# Patient Record
Sex: Male | Born: 1960 | Race: White | Hispanic: No | Marital: Single | Smoking: Former smoker
Health system: Southern US, Community
[De-identification: ages and names within clinical notes are randomized; demographics above are authoritative.]

## PROBLEM LIST (undated history)

## (undated) DIAGNOSIS — J189 Pneumonia, unspecified organism: Secondary | ICD-10-CM

## (undated) DIAGNOSIS — R7303 Prediabetes: Secondary | ICD-10-CM

## (undated) DIAGNOSIS — R0600 Dyspnea, unspecified: Secondary | ICD-10-CM

## (undated) DIAGNOSIS — J45909 Unspecified asthma, uncomplicated: Secondary | ICD-10-CM

## (undated) DIAGNOSIS — J449 Chronic obstructive pulmonary disease, unspecified: Secondary | ICD-10-CM

## (undated) HISTORY — PX: NO PAST SURGERIES: SHX2092

---

## 2015-09-19 DIAGNOSIS — J441 Chronic obstructive pulmonary disease with (acute) exacerbation: Secondary | ICD-10-CM | POA: Diagnosis not present

## 2015-09-19 DIAGNOSIS — J9621 Acute and chronic respiratory failure with hypoxia: Secondary | ICD-10-CM | POA: Diagnosis not present

## 2015-09-20 DIAGNOSIS — J9621 Acute and chronic respiratory failure with hypoxia: Secondary | ICD-10-CM | POA: Diagnosis not present

## 2015-09-20 DIAGNOSIS — J441 Chronic obstructive pulmonary disease with (acute) exacerbation: Secondary | ICD-10-CM | POA: Diagnosis not present

## 2015-09-21 DIAGNOSIS — J9621 Acute and chronic respiratory failure with hypoxia: Secondary | ICD-10-CM | POA: Diagnosis not present

## 2015-09-21 DIAGNOSIS — J441 Chronic obstructive pulmonary disease with (acute) exacerbation: Secondary | ICD-10-CM | POA: Diagnosis not present

## 2015-09-22 DIAGNOSIS — J441 Chronic obstructive pulmonary disease with (acute) exacerbation: Secondary | ICD-10-CM | POA: Diagnosis not present

## 2015-09-22 DIAGNOSIS — J9621 Acute and chronic respiratory failure with hypoxia: Secondary | ICD-10-CM | POA: Diagnosis not present

## 2016-01-30 DIAGNOSIS — J441 Chronic obstructive pulmonary disease with (acute) exacerbation: Secondary | ICD-10-CM | POA: Diagnosis not present

## 2016-01-30 DIAGNOSIS — J9621 Acute and chronic respiratory failure with hypoxia: Secondary | ICD-10-CM | POA: Diagnosis not present

## 2016-01-30 DIAGNOSIS — J189 Pneumonia, unspecified organism: Secondary | ICD-10-CM | POA: Diagnosis not present

## 2016-01-31 DIAGNOSIS — J9621 Acute and chronic respiratory failure with hypoxia: Secondary | ICD-10-CM | POA: Diagnosis not present

## 2016-01-31 DIAGNOSIS — J441 Chronic obstructive pulmonary disease with (acute) exacerbation: Secondary | ICD-10-CM | POA: Diagnosis not present

## 2016-01-31 DIAGNOSIS — J189 Pneumonia, unspecified organism: Secondary | ICD-10-CM | POA: Diagnosis not present

## 2016-02-01 DIAGNOSIS — J9621 Acute and chronic respiratory failure with hypoxia: Secondary | ICD-10-CM | POA: Diagnosis not present

## 2016-02-01 DIAGNOSIS — J189 Pneumonia, unspecified organism: Secondary | ICD-10-CM | POA: Diagnosis not present

## 2016-02-01 DIAGNOSIS — J441 Chronic obstructive pulmonary disease with (acute) exacerbation: Secondary | ICD-10-CM | POA: Diagnosis not present

## 2016-02-02 DIAGNOSIS — J9621 Acute and chronic respiratory failure with hypoxia: Secondary | ICD-10-CM | POA: Diagnosis not present

## 2016-02-02 DIAGNOSIS — J441 Chronic obstructive pulmonary disease with (acute) exacerbation: Secondary | ICD-10-CM | POA: Diagnosis not present

## 2016-02-02 DIAGNOSIS — J189 Pneumonia, unspecified organism: Secondary | ICD-10-CM | POA: Diagnosis not present

## 2016-03-11 DIAGNOSIS — J9692 Respiratory failure, unspecified with hypercapnia: Secondary | ICD-10-CM

## 2016-03-11 DIAGNOSIS — J441 Chronic obstructive pulmonary disease with (acute) exacerbation: Secondary | ICD-10-CM | POA: Diagnosis not present

## 2016-03-12 DIAGNOSIS — J441 Chronic obstructive pulmonary disease with (acute) exacerbation: Secondary | ICD-10-CM | POA: Diagnosis not present

## 2016-03-12 DIAGNOSIS — J9692 Respiratory failure, unspecified with hypercapnia: Secondary | ICD-10-CM | POA: Diagnosis not present

## 2016-03-13 DIAGNOSIS — J9692 Respiratory failure, unspecified with hypercapnia: Secondary | ICD-10-CM | POA: Diagnosis not present

## 2016-03-13 DIAGNOSIS — J441 Chronic obstructive pulmonary disease with (acute) exacerbation: Secondary | ICD-10-CM | POA: Diagnosis not present

## 2016-03-14 DIAGNOSIS — J441 Chronic obstructive pulmonary disease with (acute) exacerbation: Secondary | ICD-10-CM | POA: Diagnosis not present

## 2016-03-14 DIAGNOSIS — J9692 Respiratory failure, unspecified with hypercapnia: Secondary | ICD-10-CM | POA: Diagnosis not present

## 2016-03-15 DIAGNOSIS — J441 Chronic obstructive pulmonary disease with (acute) exacerbation: Secondary | ICD-10-CM | POA: Diagnosis not present

## 2016-03-15 DIAGNOSIS — J9692 Respiratory failure, unspecified with hypercapnia: Secondary | ICD-10-CM | POA: Diagnosis not present

## 2016-11-19 DIAGNOSIS — J9621 Acute and chronic respiratory failure with hypoxia: Secondary | ICD-10-CM

## 2016-11-19 DIAGNOSIS — J159 Unspecified bacterial pneumonia: Secondary | ICD-10-CM

## 2016-11-19 DIAGNOSIS — J181 Lobar pneumonia, unspecified organism: Secondary | ICD-10-CM

## 2016-11-19 DIAGNOSIS — J441 Chronic obstructive pulmonary disease with (acute) exacerbation: Secondary | ICD-10-CM

## 2016-11-19 DIAGNOSIS — Z8709 Personal history of other diseases of the respiratory system: Secondary | ICD-10-CM

## 2016-11-19 DIAGNOSIS — Z87891 Personal history of nicotine dependence: Secondary | ICD-10-CM

## 2017-02-23 DIAGNOSIS — J159 Unspecified bacterial pneumonia: Secondary | ICD-10-CM | POA: Diagnosis not present

## 2017-02-23 DIAGNOSIS — J9621 Acute and chronic respiratory failure with hypoxia: Secondary | ICD-10-CM | POA: Diagnosis not present

## 2017-02-23 DIAGNOSIS — J441 Chronic obstructive pulmonary disease with (acute) exacerbation: Secondary | ICD-10-CM | POA: Diagnosis not present

## 2017-02-24 DIAGNOSIS — J159 Unspecified bacterial pneumonia: Secondary | ICD-10-CM | POA: Diagnosis not present

## 2017-02-24 DIAGNOSIS — J9621 Acute and chronic respiratory failure with hypoxia: Secondary | ICD-10-CM | POA: Diagnosis not present

## 2017-02-24 DIAGNOSIS — J441 Chronic obstructive pulmonary disease with (acute) exacerbation: Secondary | ICD-10-CM | POA: Diagnosis not present

## 2017-05-02 DIAGNOSIS — J441 Chronic obstructive pulmonary disease with (acute) exacerbation: Secondary | ICD-10-CM | POA: Diagnosis not present

## 2017-05-02 DIAGNOSIS — J9621 Acute and chronic respiratory failure with hypoxia: Secondary | ICD-10-CM | POA: Diagnosis not present

## 2017-05-03 DIAGNOSIS — J9621 Acute and chronic respiratory failure with hypoxia: Secondary | ICD-10-CM | POA: Diagnosis not present

## 2017-05-03 DIAGNOSIS — J441 Chronic obstructive pulmonary disease with (acute) exacerbation: Secondary | ICD-10-CM | POA: Diagnosis not present

## 2018-04-01 DIAGNOSIS — E871 Hypo-osmolality and hyponatremia: Secondary | ICD-10-CM

## 2018-04-01 DIAGNOSIS — J9621 Acute and chronic respiratory failure with hypoxia: Secondary | ICD-10-CM

## 2018-04-01 DIAGNOSIS — J441 Chronic obstructive pulmonary disease with (acute) exacerbation: Secondary | ICD-10-CM | POA: Diagnosis not present

## 2018-04-01 DIAGNOSIS — Z720 Tobacco use: Secondary | ICD-10-CM

## 2018-04-02 DIAGNOSIS — Z720 Tobacco use: Secondary | ICD-10-CM | POA: Diagnosis not present

## 2018-04-02 DIAGNOSIS — J9621 Acute and chronic respiratory failure with hypoxia: Secondary | ICD-10-CM | POA: Diagnosis not present

## 2018-04-02 DIAGNOSIS — E871 Hypo-osmolality and hyponatremia: Secondary | ICD-10-CM | POA: Diagnosis not present

## 2018-04-02 DIAGNOSIS — J441 Chronic obstructive pulmonary disease with (acute) exacerbation: Secondary | ICD-10-CM | POA: Diagnosis not present

## 2018-04-03 DIAGNOSIS — E871 Hypo-osmolality and hyponatremia: Secondary | ICD-10-CM | POA: Diagnosis not present

## 2018-04-03 DIAGNOSIS — J441 Chronic obstructive pulmonary disease with (acute) exacerbation: Secondary | ICD-10-CM | POA: Diagnosis not present

## 2018-04-03 DIAGNOSIS — Z720 Tobacco use: Secondary | ICD-10-CM | POA: Diagnosis not present

## 2018-04-03 DIAGNOSIS — J9621 Acute and chronic respiratory failure with hypoxia: Secondary | ICD-10-CM | POA: Diagnosis not present

## 2018-05-20 DIAGNOSIS — J441 Chronic obstructive pulmonary disease with (acute) exacerbation: Secondary | ICD-10-CM | POA: Diagnosis not present

## 2018-05-20 DIAGNOSIS — Z87891 Personal history of nicotine dependence: Secondary | ICD-10-CM

## 2018-05-20 DIAGNOSIS — R0902 Hypoxemia: Secondary | ICD-10-CM

## 2018-05-20 DIAGNOSIS — R0689 Other abnormalities of breathing: Secondary | ICD-10-CM

## 2018-05-20 DIAGNOSIS — J9621 Acute and chronic respiratory failure with hypoxia: Secondary | ICD-10-CM

## 2018-05-21 DIAGNOSIS — R0689 Other abnormalities of breathing: Secondary | ICD-10-CM | POA: Diagnosis not present

## 2018-05-21 DIAGNOSIS — J441 Chronic obstructive pulmonary disease with (acute) exacerbation: Secondary | ICD-10-CM | POA: Diagnosis not present

## 2018-05-21 DIAGNOSIS — J9621 Acute and chronic respiratory failure with hypoxia: Secondary | ICD-10-CM | POA: Diagnosis not present

## 2018-05-21 DIAGNOSIS — R0902 Hypoxemia: Secondary | ICD-10-CM | POA: Diagnosis not present

## 2018-05-22 DIAGNOSIS — J9621 Acute and chronic respiratory failure with hypoxia: Secondary | ICD-10-CM | POA: Diagnosis not present

## 2018-05-22 DIAGNOSIS — J441 Chronic obstructive pulmonary disease with (acute) exacerbation: Secondary | ICD-10-CM | POA: Diagnosis not present

## 2018-05-22 DIAGNOSIS — R0902 Hypoxemia: Secondary | ICD-10-CM | POA: Diagnosis not present

## 2018-05-22 DIAGNOSIS — R0689 Other abnormalities of breathing: Secondary | ICD-10-CM | POA: Diagnosis not present

## 2018-05-23 DIAGNOSIS — R0902 Hypoxemia: Secondary | ICD-10-CM | POA: Diagnosis not present

## 2018-05-23 DIAGNOSIS — R0689 Other abnormalities of breathing: Secondary | ICD-10-CM | POA: Diagnosis not present

## 2018-05-23 DIAGNOSIS — Z87891 Personal history of nicotine dependence: Secondary | ICD-10-CM | POA: Diagnosis not present

## 2018-05-23 DIAGNOSIS — J441 Chronic obstructive pulmonary disease with (acute) exacerbation: Secondary | ICD-10-CM | POA: Diagnosis not present

## 2018-06-02 ENCOUNTER — Inpatient Hospital Stay (HOSPITAL_COMMUNITY)
Admission: AD | Admit: 2018-06-02 | Discharge: 2018-06-07 | DRG: 193 | Disposition: A | Discharge status: Home / Self Care | Payer: Medicaid Other | Source: Other Acute Inpatient Hospital | Attending: Internal Medicine | Admitting: Internal Medicine

## 2018-06-02 DIAGNOSIS — J449 Chronic obstructive pulmonary disease, unspecified: Secondary | ICD-10-CM | POA: Diagnosis not present

## 2018-06-02 DIAGNOSIS — Z87891 Personal history of nicotine dependence: Secondary | ICD-10-CM | POA: Diagnosis not present

## 2018-06-02 DIAGNOSIS — Z880 Allergy status to penicillin: Secondary | ICD-10-CM | POA: Diagnosis not present

## 2018-06-02 DIAGNOSIS — R740 Nonspecific elevation of levels of transaminase and lactic acid dehydrogenase [LDH]: Secondary | ICD-10-CM | POA: Diagnosis not present

## 2018-06-02 DIAGNOSIS — R05 Cough: Secondary | ICD-10-CM | POA: Diagnosis present

## 2018-06-02 DIAGNOSIS — R21 Rash and other nonspecific skin eruption: Secondary | ICD-10-CM | POA: Diagnosis present

## 2018-06-02 DIAGNOSIS — Z8701 Personal history of pneumonia (recurrent): Secondary | ICD-10-CM

## 2018-06-02 DIAGNOSIS — T380X5A Adverse effect of glucocorticoids and synthetic analogues, initial encounter: Secondary | ICD-10-CM | POA: Diagnosis not present

## 2018-06-02 DIAGNOSIS — R6889 Other general symptoms and signs: Secondary | ICD-10-CM | POA: Diagnosis not present

## 2018-06-02 DIAGNOSIS — J9621 Acute and chronic respiratory failure with hypoxia: Secondary | ICD-10-CM | POA: Diagnosis not present

## 2018-06-02 DIAGNOSIS — J441 Chronic obstructive pulmonary disease with (acute) exacerbation: Secondary | ICD-10-CM | POA: Diagnosis present

## 2018-06-02 DIAGNOSIS — J13 Pneumonia due to Streptococcus pneumoniae: Secondary | ICD-10-CM | POA: Diagnosis present

## 2018-06-02 DIAGNOSIS — Z9981 Dependence on supplemental oxygen: Secondary | ICD-10-CM

## 2018-06-02 DIAGNOSIS — R7989 Other specified abnormal findings of blood chemistry: Secondary | ICD-10-CM | POA: Diagnosis not present

## 2018-06-02 DIAGNOSIS — R7982 Elevated C-reactive protein (CRP): Secondary | ICD-10-CM | POA: Diagnosis not present

## 2018-06-02 DIAGNOSIS — J9622 Acute and chronic respiratory failure with hypercapnia: Secondary | ICD-10-CM | POA: Diagnosis not present

## 2018-06-02 DIAGNOSIS — J181 Lobar pneumonia, unspecified organism: Secondary | ICD-10-CM | POA: Diagnosis not present

## 2018-06-02 HISTORY — DX: Pneumonia, unspecified organism: J18.9

## 2018-06-02 HISTORY — DX: Chronic obstructive pulmonary disease, unspecified: J44.9

## 2018-06-02 HISTORY — DX: Unspecified asthma, uncomplicated: J45.909

## 2018-06-02 HISTORY — DX: Prediabetes: R73.03

## 2018-06-02 HISTORY — DX: Dyspnea, unspecified: R06.00

## 2018-06-02 LAB — GLUCOSE, CAPILLARY: Glucose-Capillary: 187 mg/dL — ABNORMAL HIGH (ref 70–99)

## 2018-06-02 MED ORDER — ORAL CARE MOUTH RINSE
15.0000 mL | Freq: Two times a day (BID) | OROMUCOSAL | Status: DC
  Administered 2018-06-03 – 2018-06-07 (×8): 15 mL via OROMUCOSAL

## 2018-06-03 ENCOUNTER — Encounter (HOSPITAL_COMMUNITY): Payer: Self-pay

## 2018-06-03 ENCOUNTER — Inpatient Hospital Stay (HOSPITAL_COMMUNITY): Payer: Medicaid Other

## 2018-06-03 ENCOUNTER — Other Ambulatory Visit: Payer: Self-pay

## 2018-06-03 DIAGNOSIS — R7982 Elevated C-reactive protein (CRP): Secondary | ICD-10-CM

## 2018-06-03 DIAGNOSIS — R740 Nonspecific elevation of levels of transaminase and lactic acid dehydrogenase [LDH]: Secondary | ICD-10-CM

## 2018-06-03 DIAGNOSIS — R7989 Other specified abnormal findings of blood chemistry: Secondary | ICD-10-CM

## 2018-06-03 DIAGNOSIS — J449 Chronic obstructive pulmonary disease, unspecified: Secondary | ICD-10-CM

## 2018-06-03 DIAGNOSIS — Z20822 Contact with and (suspected) exposure to covid-19: Secondary | ICD-10-CM | POA: Insufficient documentation

## 2018-06-03 DIAGNOSIS — J441 Chronic obstructive pulmonary disease with (acute) exacerbation: Secondary | ICD-10-CM

## 2018-06-03 DIAGNOSIS — R6889 Other general symptoms and signs: Secondary | ICD-10-CM

## 2018-06-03 DIAGNOSIS — J9622 Acute and chronic respiratory failure with hypercapnia: Secondary | ICD-10-CM

## 2018-06-03 LAB — C-REACTIVE PROTEIN: CRP: 21.2 mg/dL — ABNORMAL HIGH (ref <1.0)

## 2018-06-03 LAB — POCT I-STAT 7, (LYTES, BLD GAS, ICA,H+H)
Acid-Base Excess: 3 mmol/L — ABNORMAL HIGH (ref 0.0–2.0)
Bicarbonate: 31.2 mmol/L — ABNORMAL HIGH (ref 20.0–28.0)
Calcium, Ion: 1.26 mmol/L (ref 1.15–1.40)
HCT: 43 % (ref 39.0–52.0)
Hemoglobin: 14.6 g/dL (ref 13.0–17.0)
O2 Saturation: 93 %
Patient temperature: 98.2
Potassium: 4.6 mmol/L (ref 3.5–5.1)
Sodium: 134 mmol/L — ABNORMAL LOW (ref 135–145)
TCO2: 33 mmol/L — ABNORMAL HIGH (ref 22–32)
pCO2 arterial: 59.1 mmHg — ABNORMAL HIGH (ref 32.0–48.0)
pH, Arterial: 7.329 — ABNORMAL LOW (ref 7.350–7.450)
pO2, Arterial: 74 mmHg — ABNORMAL LOW (ref 83.0–108.0)

## 2018-06-03 LAB — CBC
HCT: 43.3 % (ref 39.0–52.0)
Hemoglobin: 13.6 g/dL (ref 13.0–17.0)
MCH: 27.5 pg (ref 26.0–34.0)
MCHC: 31.4 g/dL (ref 30.0–36.0)
MCV: 87.7 fL (ref 80.0–100.0)
Platelets: 227 10*3/uL (ref 150–400)
RBC: 4.94 MIL/uL (ref 4.22–5.81)
RDW: 14.5 % (ref 11.5–15.5)
WBC: 33 10*3/uL — ABNORMAL HIGH (ref 4.0–10.5)
nRBC: 0 % (ref 0.0–0.2)

## 2018-06-03 LAB — COMPREHENSIVE METABOLIC PANEL
ALT: 16 U/L (ref 0–44)
AST: 16 U/L (ref 15–41)
Albumin: 3.3 g/dL — ABNORMAL LOW (ref 3.5–5.0)
Alkaline Phosphatase: 51 U/L (ref 38–126)
Anion gap: 9 (ref 5–15)
BUN: 13 mg/dL (ref 6–20)
CO2: 27 mmol/L (ref 22–32)
Calcium: 9 mg/dL (ref 8.9–10.3)
Chloride: 98 mmol/L (ref 98–111)
Creatinine, Ser: 0.8 mg/dL (ref 0.61–1.24)
GFR calc Af Amer: >60 mL/min (ref >60)
GFR calc non Af Amer: >60 mL/min (ref >60)
Glucose, Bld: 160 mg/dL — ABNORMAL HIGH (ref 70–99)
Potassium: 4.5 mmol/L (ref 3.5–5.1)
Sodium: 134 mmol/L — ABNORMAL LOW (ref 135–145)
Total Bilirubin: 0.7 mg/dL (ref 0.3–1.2)
Total Protein: 6.3 g/dL — ABNORMAL LOW (ref 6.5–8.1)

## 2018-06-03 LAB — MAGNESIUM: Magnesium: 2.2 mg/dL (ref 1.7–2.4)

## 2018-06-03 LAB — HIV ANTIBODY (ROUTINE TESTING W REFLEX): HIV Screen 4th Generation wRfx: NONREACTIVE

## 2018-06-03 LAB — LACTIC ACID, PLASMA
Lactic Acid, Venous: 1.3 mmol/L (ref 0.5–1.9)
Lactic Acid, Venous: 1.1 mmol/L (ref 0.5–1.9)

## 2018-06-03 LAB — MRSA PCR SCREENING: MRSA by PCR: NEGATIVE

## 2018-06-03 LAB — D-DIMER, QUANTITATIVE: D-Dimer, Quant: 1.8 ug/mL-FEU — ABNORMAL HIGH (ref 0.00–0.50)

## 2018-06-03 LAB — FERRITIN: Ferritin: 82 ng/mL (ref 24–336)

## 2018-06-03 LAB — PHOSPHORUS: Phosphorus: 2.5 mg/dL (ref 2.5–4.6)

## 2018-06-03 LAB — TROPONIN I: Troponin I: <0.03 ng/mL (ref <0.03)

## 2018-06-03 LAB — BRAIN NATRIURETIC PEPTIDE: B Natriuretic Peptide: 88.5 pg/mL (ref 0.0–100.0)

## 2018-06-03 LAB — PROCALCITONIN: Procalcitonin: 7.65 ng/mL

## 2018-06-03 LAB — STREP PNEUMONIAE URINARY ANTIGEN: Strep Pneumo Urinary Antigen: POSITIVE — AB

## 2018-06-03 MED ORDER — PANTOPRAZOLE SODIUM 40 MG IV SOLR
40.0000 mg | Freq: Every day | INTRAVENOUS | Status: DC
  Administered 2018-06-03: 40 mg via INTRAVENOUS
  Filled 2018-06-03: qty 40

## 2018-06-03 MED ORDER — SODIUM CHLORIDE 0.9 % IV SOLN
1.0000 g | INTRAVENOUS | Status: DC
  Administered 2018-06-03 – 2018-06-04 (×2): 1 g via INTRAVENOUS
  Filled 2018-06-03 (×3): qty 10

## 2018-06-03 MED ORDER — DEXTROSE 5 % IV SOLN
250.0000 mg | INTRAVENOUS | Status: DC
  Filled 2018-06-03 (×2): qty 250

## 2018-06-03 MED ORDER — SODIUM CHLORIDE 0.9 % IV SOLN
2.0000 g | Freq: Once | INTRAVENOUS | Status: AC
  Administered 2018-06-03: 03:00:00 2 g via INTRAVENOUS
  Filled 2018-06-03: qty 2

## 2018-06-03 MED ORDER — FLUTICASONE FUROATE-VILANTEROL 100-25 MCG/INH IN AEPB
1.0000 | INHALATION_SPRAY | Freq: Every day | RESPIRATORY_TRACT | Status: DC
  Administered 2018-06-03 – 2018-06-07 (×5): 1 via RESPIRATORY_TRACT
  Filled 2018-06-03: qty 28

## 2018-06-03 MED ORDER — IPRATROPIUM BROMIDE HFA 17 MCG/ACT IN AERS
2.0000 | INHALATION_SPRAY | RESPIRATORY_TRACT | Status: DC | PRN
  Administered 2018-06-03 – 2018-06-06 (×4): 2 via RESPIRATORY_TRACT
  Filled 2018-06-03: qty 12.9

## 2018-06-03 MED ORDER — PREDNISONE 20 MG PO TABS
40.0000 mg | ORAL_TABLET | Freq: Every day | ORAL | Status: DC
  Administered 2018-06-04 – 2018-06-06 (×3): 40 mg via ORAL
  Filled 2018-06-03 (×3): qty 2

## 2018-06-03 MED ORDER — ALBUTEROL SULFATE HFA 108 (90 BASE) MCG/ACT IN AERS
2.0000 | INHALATION_SPRAY | RESPIRATORY_TRACT | Status: DC | PRN
  Administered 2018-06-03 – 2018-06-06 (×3): 2 via RESPIRATORY_TRACT
  Filled 2018-06-03: qty 6.7

## 2018-06-03 MED ORDER — UMECLIDINIUM BROMIDE 62.5 MCG/INH IN AEPB
1.0000 | INHALATION_SPRAY | Freq: Every day | RESPIRATORY_TRACT | Status: DC
  Administered 2018-06-03 – 2018-06-07 (×5): 1 via RESPIRATORY_TRACT
  Filled 2018-06-03: qty 7

## 2018-06-03 MED ORDER — ONDANSETRON HCL 4 MG/2ML IJ SOLN: 4.0000 mg | Freq: Four times a day (QID) | INTRAMUSCULAR | Status: DC | PRN

## 2018-06-03 MED ORDER — METHYLPREDNISOLONE SODIUM SUCC 125 MG IJ SOLR
80.0000 mg | Freq: Every day | INTRAMUSCULAR | Status: DC
  Administered 2018-06-03: 80 mg via INTRAVENOUS
  Filled 2018-06-03: qty 2

## 2018-06-03 MED ORDER — SODIUM CHLORIDE 0.9 % IV SOLN
1.0000 g | Freq: Three times a day (TID) | INTRAVENOUS | Status: DC
  Administered 2018-06-03: 09:00:00 1 g via INTRAVENOUS
  Filled 2018-06-03 (×4): qty 1

## 2018-06-03 MED ORDER — ENOXAPARIN SODIUM 40 MG/0.4ML ~~LOC~~ SOLN
40.0000 mg | SUBCUTANEOUS | Status: DC
  Administered 2018-06-03 – 2018-06-07 (×5): 40 mg via SUBCUTANEOUS
  Filled 2018-06-03 (×5): qty 0.4

## 2018-06-03 MED ORDER — ACETAMINOPHEN 325 MG PO TABS
650.0000 mg | ORAL_TABLET | ORAL | Status: DC | PRN
  Administered 2018-06-04 – 2018-06-06 (×3): 650 mg via ORAL
  Filled 2018-06-03 (×3): qty 2

## 2018-06-03 MED ORDER — SODIUM CHLORIDE 0.9 % IV SOLN
500.0000 mg | INTRAVENOUS | Status: DC
  Administered 2018-06-04 – 2018-06-05 (×2): 500 mg via INTRAVENOUS
  Filled 2018-06-03 (×4): qty 500

## 2018-06-03 MED ORDER — MORPHINE SULFATE (PF) 2 MG/ML IV SOLN
2.0000 mg | Freq: Once | INTRAVENOUS | Status: AC
  Administered 2018-06-03: 2 mg via INTRAVENOUS
  Filled 2018-06-03: qty 1

## 2018-06-03 MED ORDER — DIPHENHYDRAMINE HCL 25 MG PO CAPS
25.0000 mg | ORAL_CAPSULE | Freq: Four times a day (QID) | ORAL | Status: DC | PRN
  Administered 2018-06-03 – 2018-06-07 (×13): 25 mg via ORAL
  Filled 2018-06-03 (×13): qty 1

## 2018-06-03 NOTE — Progress Notes (Signed)
Report given to [facility] RN 2 [facility].

## 2018-06-03 NOTE — H&P (Signed)
..   NAME:  Cody Swanson, MRN:  [id number], DOB:  1961/01/29, LOS: 1 ADMISSION DATE:  06/02/2018, CONSULTATION DATE:  06/02/18 REFERRING MD:  tx from [place] CHIEF COMPLAINT:  Dyspnea, and cough  Brief History   58 year old man with hx of COPD, on oxygen at night (2L), sometimes at rest, and during exertion (4L).  Here with sudden onset dyspnea and productive cough, rule out for COVID-19.    History of present illness   Was sitting on his porch this am and developed sudden shortness of breath and sudden cough.   Feels much better now, for unclear reasons.   He says this feels different that his usual pna or COPD exacerbations (more severe) Sputum initially clear now yellow.  Denies fever, rhinitis, sinus pain, congestion, sore throat.   Influenza negative at [place].  Lives with a couple and their adult kids, no sick contacts.  No travel.   Past Medical History  COPD  - on trelegy inhaler, albuterol inhaler prn  alt with albuterol nebs prn (usually albuterol BID) Hx PNA Hx flu  Significant Hospital Events   Transferred from [facility] ER   Consults:    Procedures:    Significant Diagnostic Tests:  CXR 06/02/18:  Report: RLL airspace disease c/w PNA, no effusion no ptx.   CT chest without contrast 06/02/18 Report: B nodular densities with Tree in bud appearance progression since prior CT (c/w pneumonia, likely of atypical etiology).  Interval worsening of B pulmonary nodules c/w atypical infectious process.   At [place] 4/4  WBC 26, Hb 14.8, Hct 45.4, MCV 87, Plt 263, Lymphs percentage 1% PTT 24.2, INR 1, PT 10 Ph 7.4/43/124 Na 133 K 4.4 cl 97 hco3 27 BUN 16 Cr 0.8, Glud 100 Ca 9, bili 0.8 AST 28. ALT 21 AP 69  Flu type a/b antigen screen: negative  CK 38 LDH 883 Trop <0.01 CRP 48 BNP 55 Total protein 7.3  Procal 0.43    Micro Data:  COVID pending at [facility]   Antimicrobials:  Cefepime azithro   Interim history/subjective:  Transferred from [facility] 4/4  late PM.   Objective   Temperature 98.2 F (36.8 C), temperature source Oral, weight 80 kg.       No intake or output data in the 24 hours ending 06/03/18 0010 Filed Weights   06/02/18 2323  Weight: 80 kg   Blood pressure 125/79, pulse 99, temperature 98.2 F (36.8 C), temperature source Oral, resp. rate 15, height 5\' 4"  (1.626 m), weight 80 kg, SpO2 97 %. 4L [name]  Examination: General: pleasant, no increased WOB at rest, increased WOB with talking in short sentences HENT: NCAT, perrl, mmm Lungs: CTAB with disposable stethoscope Cardiovascular: regular rate and rhythm on telemetry  Abdomen: obese, NT, ND Extremities: No edema, no erythema Neuro: Awake and alert, mentating well Skin: rash, serpiginous erythematous, raised plaques, on lower abdomen, axilla, R thigh (lateral)  Resolved Hospital Problem list    Assessment & Plan:  1. Acute shortness of breath, acute cough: Pt with history of COPD, frequent pna and hospitalizations for COPD Exacerbations.  Admitted as recently as March at [facility] per patient.   Viral (covid) vs bacterial pna.  CRP, ferritin, d-dimer, procalcitonin , troponin ordered.   Influenza negative.  Covid pending from [place] (I am told?) CXR pending.   PNA coverage with cefepime, azithromycin.  MRSA negative.  Solumedrol for COPD exacerbation.   Cont equivalent of home inhaler, albuterol and ipratropium inhalers.  2. COPD   3. Rash:  Does not appear like drug rash (received ceftriaxone and azithro).  Rash on abdomen present for months, he has not noticed the rash on thigh.   Benadryl for itch.    Admitted to ICU from [facility] (prior to this was on 8L HFNC).  Now is stable on 4L [place], though increased WOB.    Best practice:  Diet: regular Pain/Anxiety/Delirium protocol (if indicated):  VAP protocol (if indicated):  DVT prophylaxis: lovenox GI prophylaxis: protonix Glucose control:  Mobility: oob as tolerates, with assist Code Status: Full  code  Family Communication:  Disposition:   Medical decision maker [name]   CBC: No results for input(s): WBC, NEUTROABS, HGB, HCT, MCV, PLT in the last 168 hours.  Basic Metabolic Panel: No results for input(s): NA, K, CL, CO2, GLUCOSE, BUN, CREATININE, CALCIUM, MG, PHOS in the last 168 hours. GFR: CrCl cannot be calculated (No successful lab value found.). No results for input(s): PROCALCITON, WBC, LATICACIDVEN in the last 168 hours.  Liver Function Tests: No results for input(s): AST, ALT, ALKPHOS, BILITOT, PROT, ALBUMIN in the last 168 hours. No results for input(s): LIPASE, AMYLASE in the last 168 hours. No results for input(s): AMMONIA in the last 168 hours.  ABG No results found for: PHART, PCO2ART, PO2ART, HCO3, TCO2, ACIDBASEDEF, O2SAT   Coagulation Profile: No results for input(s): INR, PROTIME in the last 168 hours.  Cardiac Enzymes: No results for input(s): CKTOTAL, CKMB, CKMBINDEX, TROPONINI in the last 168 hours.  HbA1C: No results found for: HGBA1C  CBG: Recent Labs  Lab 06/02/18 2330  GLUCAP 187*    Review of Systems:   Review of Systems  Constitutional: Negative for chills, fever and weight loss.  HENT: Negative for congestion, sinus pain and sore throat.   Eyes: Negative for blurred vision.  Respiratory: Positive for cough and sputum production. Negative for hemoptysis and stridor.   Cardiovascular: Negative for chest pain, palpitations and leg swelling.  Skin: Positive for rash.     Past Medical History  He,  has a past medical history of Asthma and COPD (chronic obstructive pulmonary disease) (HCC).   Medications:  @ [place]: Albuterol, duonebs  Azithromycin 500mg  once  Ceftriaxone  Solumedrol 125 x 1 Trelegy at home Albuterol inhaler, albuterol nebs prn.   Surgical History   History reviewed. No pertinent surgical history.   Social History   reports that he quit smoking about 20 years ago. His smoking use included  cigarettes. He has a 30.00 pack-year smoking history. He has never used smokeless tobacco.   Family History   His family history is not on file.   Allergies Allergies  Allergen Reactions  . Penicillins     Rash, SOB     Home Medications  Prior to Admission medications   Medication Sig Start Date End Date Taking? Authorizing Provider  albuterol (PROVENTIL) (5 MG/ML) 0.5% nebulizer solution Take 2.5 mg by nebulization every 6 (six) hours as needed for wheezing or shortness of breath.   Yes [provider]     Critical care time: 75 minutes

## 2018-06-03 NOTE — Progress Notes (Signed)
..   NAME:  Cody Swanson, MRN:  [id number], DOB:  1960/06/30, LOS: 1 ADMISSION DATE:  06/02/2018, CONSULTATION DATE:  06/02/18 REFERRING MD:  tx from [place] CHIEF COMPLAINT:  Dyspnea, and cough  Brief History   58 year old man with hx of COPD, on oxygen at night (2L), sometimes at rest, and during exertion (4L).  Here with sudden onset dyspnea and productive cough, rule out for COVID-19.   History of present illness   Was sitting on his porch this am and developed sudden shortness of breath and sudden cough.   Feels much better now, for unclear reasons.  He says this feels different that his usual pna or COPD exacerbations (more severe) Sputum initially clear now yellow. Denies fever, rhinitis, sinus pain, congestion, sore throat. Influenza negative at [place]. Lives with a couple and their adult kids, no sick contacts.  No travel.   Past Medical History  COPD  - on trelegy inhaler, albuterol inhaler prn  alt with albuterol nebs prn (usually albuterol BID) Hx PNA Hx Flu   Significant Hospital Events   Transferred from [facility] ER   Consults:  PCCM   Procedures:    Significant Diagnostic Tests:   CXR 06/02/18:  Report: RLL airspace disease c/w PNA, no effusion no ptx.   CT chest without contrast 06/02/18 Report: B nodular densities with Tree in bud appearance progression since prior CT (c/w pneumonia, likely of atypical etiology).  Interval worsening of B pulmonary nodules c/w atypical infectious process.   At [place] 4/4  WBC 26, Hb 14.8, Hct 45.4, MCV 87, Plt 263, Lymphs percentage 1% PTT 24.2, INR 1, PT 10 Ph 7.4/43/124 Na 133 K 4.4 cl 97 hco3 27 BUN 16 Cr 0.8, Glud 100 Ca 9, bili 0.8 AST 28. ALT 21 AP 69  Flu type a/b antigen screen: negative  CK 38 LDH 883 Trop <0.01 CRP 48 BNP 55 Total protein 7.3  Procal 0.43   Micro Data:  4/4: COVID - PENDING ([place])   Antimicrobials:  Cefepime Azithromycin   Interim history/subjective:  Overall patient states  that he is feeling better. Appears to be resting comfortably.   Objective   Blood pressure 130/79, pulse 83, temperature 97.7 F (36.5 C), temperature source Oral, resp. rate (!) 21, height 5\' 4"  (1.626 m), weight 80 kg, SpO2 97 %.        Intake/Output Summary (Last 24 hours) at 06/03/2018 0821 Last data filed at 06/03/2018 0500 Gross per 24 hour  Intake 100 ml  Output 500 ml  Net -400 ml   Filed Weights   06/02/18 2323 06/03/18 0012 06/03/18 0233  Weight: 80 kg 80 kg 80 kg   BP 123/88   Pulse 87   Temp 97.8 F (36.6 C) (Oral)   Resp 17   Ht 5\' 4"  (1.626 m)   Wt 80 kg   SpO2 97%   BMI 30.27 kg/m    Examination: General: elderly male, resting in bed, NAD HENT: NCAT, sclera clear  Lungs: no crackles, no wheeze Cardiovascular: tachy cardic. s1 s2, no mrg    Abdomen: soft, nt nd  Extremities: no significant edema  Neuro: AAOX3, no deficit   Resolved Hospital Problem list    Assessment & Plan:   AECOPD AHRF, on [place] O2 Viral (covid) vs bacterial pna. +procalcitonin, +ddimer 1.8, CRP 21 Influenza negative Covid pending from [place] per documentation  PNA coverage with cefepime, azithromycin.   MRSA PCR negative.  - de-escalated to prednisone oral  -  inhaler regimen, albuterol, ipratropium, breo, incruse  - patient is on 2-4L [place] at home per patient  - Sats stable on 4L at this time   Rash - will continue to observe   - appears to have resolved per patient    Best practice:  Diet: regular Pain/Anxiety/Delirium protocol (if indicated):  VAP protocol (if indicated):  DVT prophylaxis: lovenox GI prophylaxis: protonix Glucose control:  Mobility: oob as tolerates, with assist Code Status: Full code  Family Communication:  Disposition: ICU   Medical decision maker Janese Banks   Labs   CBC: Recent Labs  Lab 06/03/18 0336 06/03/18 0449  WBC 33.0*  --   HGB 13.6 14.6  HCT 43.3 43.0  MCV 87.7  --   PLT 227  --     Basic Metabolic Panel: Recent Labs   Lab 06/03/18 0336 06/03/18 0449  NA 134* 134*  K 4.5 4.6  CL 98  --   CO2 27  --   GLUCOSE 160*  --   BUN 13  --   CREATININE 0.80  --   CALCIUM 9.0  --   MG 2.2  --   PHOS 2.5  --    GFR: Estimated Creatinine Clearance: 97.3 mL/min (by C-G formula based on SCr of 0.8 mg/dL). Recent Labs  Lab 06/03/18 0306 06/03/18 0336 06/03/18 0709  PROCALCITON  --  7.65  --   WBC  --  33.0*  --   LATICACIDVEN 1.3  --  1.1    Liver Function Tests: Recent Labs  Lab 06/03/18 0336  AST 16  ALT 16  ALKPHOS 51  BILITOT 0.7  PROT 6.3*  ALBUMIN 3.3*   No results for input(s): LIPASE, AMYLASE in the last 168 hours. No results for input(s): AMMONIA in the last 168 hours.  ABG    Component Value Date/Time   PHART 7.329 (L) 06/03/2018 0449   PCO2ART 59.1 (H) 06/03/2018 0449   PO2ART 74.0 (L) 06/03/2018 0449   HCO3 31.2 (H) 06/03/2018 0449   TCO2 33 (H) 06/03/2018 0449   O2SAT 93.0 06/03/2018 0449     Coagulation Profile: No results for input(s): INR, PROTIME in the last 168 hours.  Cardiac Enzymes: Recent Labs  Lab 06/03/18 0306  TROPONINI <0.03    HbA1C: No results found for: HGBA1C  CBG: Recent Labs  Lab 06/02/18 2330  GLUCAP 187*    This patient is critically ill with multiple organ system failure; which, requires frequent high complexity decision making, assessment, support, evaluation, and titration of therapies. This was completed through the application of advanced monitoring technologies and extensive interpretation of multiple databases. During this encounter critical care time was devoted to patient care services described in this note for 32 minutes.   Josephine Igo, DO [facility] 06/03/2018 8:44 AM  Personal pager: [phone] If unanswered, please page CCM On-call: [phone]

## 2018-06-03 NOTE — Progress Notes (Signed)
Pharmacy Antibiotic Note  Aldus Lopera is a 58 y.o. male admitted on 06/02/2018 with pneumonia.  Pharmacy has been consulted for cefepime dosing.  Plan: Cefepime 2gm IV x 1 then 1gm IV q8 hours F/u renal function, cultures and clinical course  Height: 5\' 4"  (162.6 cm) Weight: 176 lb 5.9 oz (80 kg) IBW/kg (Calculated) : 59.2  Temp (24hrs), Avg:98.2 F (36.8 C), Min:98.2 F (36.8 C), Max:98.2 F (36.8 C)  Recent Labs  Lab 06/03/18 0306 06/03/18 0336  CREATININE  --  0.80  LATICACIDVEN 1.3  --     Estimated Creatinine Clearance: 97.3 mL/min (by C-G formula based on SCr of 0.8 mg/dL).    Allergies  Allergen Reactions  . Penicillins     Rash, SOB    Thank you for allowing pharmacy to be a part of this patient's care.  Talbert Cage [place] 06/03/2018 4:27 AM

## 2018-06-03 NOTE — Progress Notes (Signed)
Pt called out to this RN complaining of SOB. Patient in tripod position in bed. RN Katelind in patient room. Administering PRN Atrovent. Will try PRN Albuterol if no improvement. ELink called. Vital signs stable at the moment.   Elink ordered 1x morphine   Noted improvement 30 mins post intervention. Patient resting comfortably in bed. All belonging in reach.

## 2018-06-04 DIAGNOSIS — J449 Chronic obstructive pulmonary disease, unspecified: Secondary | ICD-10-CM

## 2018-06-04 DIAGNOSIS — J181 Lobar pneumonia, unspecified organism: Secondary | ICD-10-CM

## 2018-06-04 DIAGNOSIS — J9621 Acute and chronic respiratory failure with hypoxia: Secondary | ICD-10-CM

## 2018-06-04 LAB — CBC
HCT: 40.8 % (ref 39.0–52.0)
Hemoglobin: 12.3 g/dL — ABNORMAL LOW (ref 13.0–17.0)
MCH: 27.1 pg (ref 26.0–34.0)
MCHC: 30.1 g/dL (ref 30.0–36.0)
MCV: 89.9 fL (ref 80.0–100.0)
Platelets: 207 10*3/uL (ref 150–400)
RBC: 4.54 MIL/uL (ref 4.22–5.81)
RDW: 14.4 % (ref 11.5–15.5)
WBC: 20 10*3/uL — ABNORMAL HIGH (ref 4.0–10.5)
nRBC: 0 % (ref 0.0–0.2)

## 2018-06-04 LAB — LEGIONELLA PNEUMOPHILA SEROGP 1 UR AG: L. pneumophila Serogp 1 Ur Ag: NEGATIVE

## 2018-06-04 NOTE — Progress Notes (Signed)
Contacted [place] hospital, informed Covid test was just sent off yesterday and is stilll pending.

## 2018-06-04 NOTE — Progress Notes (Signed)
RT NOTES:  RN instructed on flutter valve.

## 2018-06-04 NOTE — Progress Notes (Signed)
No distress at rest. No new complaints. Still feels very SOB with minimal exertion. Denies CP, hemoptysis  Vitals:   06/04/18 0009 06/04/18 0540 06/04/18 0606 06/04/18 0823  BP: 130/83  122/79 126/73  Pulse: 88  79 (!) 101  Resp: 18  20 20   Temp: (!) 97.5 F (36.4 C)  98.1 F (36.7 C) (!) 97.5 F (36.4 C)  TempSrc: Oral  Oral Oral  SpO2: 94%  98% 92%  Weight:  83.2 kg    Height:       2 LPM [place]  Gen: NAD HEENT: NCAT, sclerae white Neck: No JVD notes Lungs: breath sounds mildly diminished, distant wheezes noted Cardiovascular: RRR, no murmurs Abdomen: Soft, nontender, normal BS Ext: without clubbing, cyanosis, edema Neuro: grossly intact Skin: Limited exam, no lesions noted   BMP Latest Ref Rng & Units 06/03/2018 06/03/2018  Glucose 70 - 99 mg/dL - 562(B)  BUN 6 - 20 mg/dL - 13  Creatinine 6.38 - 1.24 mg/dL - 9.37  Sodium 342 - 876 mmol/L 134(L) 134(L)  Potassium 3.5 - 5.1 mmol/L 4.6 4.5  Chloride 98 - 111 mmol/L - 98  CO2 22 - 32 mmol/L - 27  Calcium 8.9 - 10.3 mg/dL - 9.0   CBC Latest Ref Rng & Units 06/04/2018 06/03/2018 06/03/2018  WBC 4.0 - 10.5 K/uL 20.0(H) - 33.0(H)  Hemoglobin 13.0 - 17.0 g/dL 12.3(L) 14.6 13.6  Hematocrit 39.0 - 52.0 % 40.8 43.0 43.3  Platelets 150 - 400 K/uL 207 - 227   PCT 7.65  Urine strep Ag is positive  No new CXR  IMPRESSION: Acute on chronic hypoxemic respiratory failure Underlying severe COPD - 2-4 LPM by [place] chronically Acute infectious PNA - atypical appearance (tree-in-bud) on CT chest  Elevated PCT and + urine antigen for pneumococcus  Doubt Covid-19  PLAN/REC: Discussed with Dr Waymon Amato Cont current abx while hospitalized Likely DC home in next day or two Would complete 10 days of abx with doxycycline after discharge   Covering for possible atypicals given CT chest appearance He already has F/U with his primary MD and pulmonologist   PCCM will sign off. Please call if we can be of further assistance  Billy Fischer, MD PCCM  service Mobile [phone] Pager [phone] 06/04/2018 11:44 AM

## 2018-06-04 NOTE — Plan of Care (Signed)
  Problem: Education: Goal: Knowledge of General Education information will improve Description Including pain rating scale, medication(s)/side effects and non-pharmacologic comfort measures Outcome: Progressing   Problem: Health Behavior/Discharge Planning: Goal: Ability to manage health-related needs will improve Outcome: Progressing   Problem: Clinical Measurements: Goal: Ability to maintain clinical measurements within normal limits will improve Outcome: Progressing Goal: Will remain free from infection Outcome: Progressing Goal: Diagnostic test results will improve Outcome: Progressing Goal: Respiratory complications will improve Outcome: Progressing Goal: Cardiovascular complication will be avoided Outcome: Progressing   Problem: Skin Integrity: Goal: Risk for impaired skin integrity will decrease Outcome: Progressing   Problem: Respiratory: Goal: Ability to maintain adequate ventilation will improve Outcome: Progressing Goal: Ability to maintain a clear airway will improve Outcome: Progressing   Problem: Education: Goal: Knowledge of disease or condition will improve Outcome: Progressing Goal: Knowledge of the prescribed therapeutic regimen will improve Outcome: Progressing Goal: Individualized Educational Video(s) Outcome: Progressing   Problem: Activity: Goal: Ability to tolerate increased activity will improve Outcome: Progressing Goal: Will verbalize the importance of balancing activity with adequate rest periods Outcome: Progressing   Problem: Respiratory: Goal: Ability to maintain a clear airway will improve Outcome: Progressing Goal: Levels of oxygenation will improve Outcome: Progressing Goal: Ability to maintain adequate ventilation will improve Outcome: Progressing

## 2018-06-04 NOTE — Progress Notes (Signed)
PROGRESS NOTE   Cody Swanson  QIH:[id number]    DOB: 08/31/1960    DOA: 06/02/2018  PCP: Aida Puffer, MD   I have briefly reviewed patients previous medical records in [facility].  Brief Narrative:  58 year old male, lives with friends/family, independent, PMH of COPD, chronic hypoxic respiratory failure on home oxygen 2-4 L (at rest and with activity respectively), initially presented to [facility] due to sudden onset of dyspnea and productive cough, transferred to ICU under CCM care with diagnosis of COPD exacerbation, rule out Covid-19.  Clinically improved and transferred to floor and TRH care on 4/6.  Assessment & Plan:   Active Problems:   COPD (chronic obstructive pulmonary disease) (HCC)   Suspected Covid-19 Virus Infection   1. Acute atypical infectious pneumonia: As per pulmonology follow-up today, patient had atypical appearance (tree-in-bud) on CT chest.  PCT elevated and urine pneumococcal antigen positive.  Doubt Covid but results from [facility] pending.  MRSA PCR negative.  Blood cultures x2: Negative to date.  Chest x-ray 4/5 clear.  On empiric IV antibiotics since 4/4, currently on ceftriaxone and azithromycin, continue while hospitalized then transition to oral doxycycline as per CCM recommendations to complete total 10 days course covering for possible atypicals given CT chest appearance.  Outpatient follow-up with PCP and pulmonology.  BNP 89, ferritin 82, CRP 21, lactate normal, procalcitonin 7.65, d-dimer 1.8, HIV screen negative 2. R/O Covid-19: Results pending. 3. Acute on chronic hypoxemic respiratory failure: Due to underlying severe COPD complicated by atypical pneumonia.  Patient currently back to his baseline home oxygen but still dyspneic with minimal activity.  Continue current management and follow closely. 4. COPD exacerbation: Has underlying severe COPD.  Former smoker quit more than a decade ago.  Antibiotics as above.  Continue prednisone 40  mg daily and taper at discharge.  Continue Breo and Incruse. 5. Leukocytosis: Secondary to steroids.  Improving. 6. Rash: Seems to have resolved.  Isolation/PPE: Droplet and contact.  Patient: Surgical face mask.  Provider: Head cover, CAPR, gown, gloves and shoe cover DVT prophylaxis: Lovenox Code Status: Full Family Communication: None at bedside Disposition: DC home pending clinical improvement, possibly in the next 24 hours.   Consultants:  PCCM-signed off 4/6.  Procedures:  None.  Antimicrobials:  IV cefepime x1 dose 4/4 IV ceftriaxone and azithromycin 4/5 >   Subjective: Mild dry cough, dyspnea on exertion, mild headache.  Overall feels better compared to admission but breathing not yet at baseline.  Wants to take a shower.  Lives with close friends who he considers as family.  Independent.  ROS: As above, otherwise negative  Objective:  Vitals:   06/04/18 0009 06/04/18 0540 06/04/18 0606 06/04/18 0823  BP: 130/83  122/79 126/73  Pulse: 88  79 (!) 101  Resp: Temp: (!) 97.5 F (36.4 C)  98.1 F (36.7 C) (!) 97.5 F (36.4 C)  TempSrc: Oral  Oral Oral  SpO2: 94%  98% 92%  Weight:  83.2 kg    Height:        Examination:  General exam: Pleasant young male, moderately built and nourished sitting up comfortably in bed. Respiratory system: Slightly harsh breath sounds but no wheezing, rhonchi or crackles. Respiratory effort normal.  Able to speak in full sentences. Cardiovascular system: S1 & S2 heard, RRR. No JVD, murmurs, rubs, gallops or clicks. No pedal edema.  Telemetry personally reviewed: Sinus rhythm-discontinued telemetry. Gastrointestinal system: Abdomen is nondistended, soft and nontender. No organomegaly or  masses felt. Normal bowel sounds heard. Central nervous system: Alert and oriented. No focal neurological deficits. Extremities: Symmetric 5 x 5 power. Skin: No rashes, lesions or ulcers Psychiatry: Judgement and insight appear normal. Mood  & affect appropriate.     Data Reviewed: I have personally reviewed following labs and imaging studies  CBC: Recent Labs  Lab 06/03/18 0336 06/03/18 0449 06/04/18 0757  WBC 33.0*  --  20.0*  HGB 13.6 14.6 12.3*  HCT 43.3 43.0 40.8  MCV 87.7  --  89.9  PLT 227  --  207   Basic Metabolic Panel: Recent Labs  Lab 06/03/18 0336 06/03/18 0449  NA 134* 134*  K 4.5 4.6  CL 98  --   CO2 27  --   GLUCOSE 160*  --   BUN 13  --   CREATININE 0.80  --   CALCIUM 9.0  --   MG 2.2  --   PHOS 2.5  --    Liver Function Tests: Recent Labs  Lab 06/03/18 0336  AST 16  ALT 16  ALKPHOS 51  BILITOT 0.7  PROT 6.3*  ALBUMIN 3.3*   Cardiac Enzymes: Recent Labs  Lab 06/03/18 0306  TROPONINI <0.03   CBG: Recent Labs  Lab 06/02/18 2330  GLUCAP 187*    Recent Results (from the past 240 hour(s))  MRSA PCR Screening     Status: None   Collection Time: 06/02/18 11:40 PM  Result Value Ref Range Status   MRSA by PCR NEGATIVE NEGATIVE Final    Comment:        The GeneXpert MRSA Assay (FDA approved for NASAL specimens only), is one component of a comprehensive MRSA colonization surveillance program. It is not intended to diagnose MRSA infection nor to guide or monitor treatment for MRSA infections. Performed at [facility], [address] [place], [place] [address]   Culture, blood (routine x 2)     Status: None (Preliminary result)   Collection Time: 06/03/18  2:59 AM  Result Value Ref Range Status   Specimen Description BLOOD RIGHT ANTECUBITAL  Final   Special Requests   Final    BOTTLES DRAWN AEROBIC ONLY Blood Culture adequate volume   Culture   Final    NO GROWTH 1 DAY Performed at [facility], [address] [place], [place] [address]    Report Status PENDING  Incomplete  Culture, blood (routine x 2)     Status: None (Preliminary result)   Collection Time: 06/03/18  3:05 AM  Result Value Ref Range Status   Specimen Description BLOOD LEFT HAND   Final   Special Requests   Final    BOTTLES DRAWN AEROBIC ONLY Blood Culture adequate volume   Culture   Final    NO GROWTH 1 DAY Performed at [facility], [address], [place], [place] [address]    Report Status PENDING  Incomplete         Radiology Studies: Dg Chest Port 1 View  Result Date: 06/03/2018 CLINICAL DATA:  Pneumonia and shortness of breath EXAM: PORTABLE CHEST 1 VIEW COMPARISON:  Chest CT 06/02/2018 FINDINGS: The heart size and mediastinal contours are within normal limits. The lung bases are excluded from view but the visualized lungs are clear. The visualized skeletal structures are unremarkable. IMPRESSION: Lung bases are not within the included field of view. The visualized lungs are clear. Electronically Signed   By: Deatra Robinson M.D.   On: 06/03/2018 02:38  Scheduled Meds: . enoxaparin (LOVENOX) injection  40 mg Subcutaneous Q24H  . fluticasone furoate-vilanterol  1 puff Inhalation Daily  . mouth rinse  15 mL Mouth Rinse BID  . predniSONE  40 mg Oral Q breakfast  . umeclidinium bromide  1 puff Inhalation Daily   Continuous Infusions: . azithromycin 500 mg (06/04/18 1055)  . cefTRIAXone (ROCEPHIN)  IV 1 g (06/03/18 1609)     LOS: 2 days     Marcellus Scott, MD, FACP, [facility]. [facility]  To contact the attending provider between 7A-7P or the covering provider during after hours 7P-7A, please log into the web site [email or web address] and access using universal [place] password for that web site. If you do not have the password, please call the hospital operator.  06/04/2018, 2:25 PM

## 2018-06-05 MED ORDER — DOXYCYCLINE HYCLATE 100 MG PO TABS
100.0000 mg | ORAL_TABLET | Freq: Two times a day (BID) | ORAL | Status: DC
  Administered 2018-06-05 – 2018-06-07 (×5): 100 mg via ORAL
  Filled 2018-06-05 (×5): qty 1

## 2018-06-05 NOTE — Progress Notes (Signed)
Cody Swanson is a 58 y.o. male patient admitted from ED awake, alert - oriented  X 4 - no acute distress noted.  VSS - Blood pressure 124/81, pulse 89, temperature 97.8 F (36.6 C), temperature source Oral, resp. rate 20, height 5\' 5"  (1.651 m), weight 86.5 kg, SpO2 95 %.    IV in place, occlusive dsg intact without redness.  Orientation to room, and floor completed with information packet given to patient/family.  Patient declined safety video at this time.  Admission INP armband ID verified with patient/family, and in place.   SR up x 2, fall assessment complete, with patient and family able to verbalize understanding of risk associated with falls, and verbalized understanding to call nsg before up out of bed.  Call light within reach, patient able to voice, and demonstrate understanding.  Skin tear on Left arm and ecchymosis on bilateral upper extremities. No evidence of skin break down noted on exam.     Will cont to eval and treat per MD orders.  Jeralene Peters, RN 06/05/2018 12:14 PM

## 2018-06-05 NOTE — Progress Notes (Signed)
PROGRESS NOTE   Cody Swanson  ZOX:[id number]    DOB: May 01, 1960    DOA: 06/02/2018  PCP: Aida Puffer, MD   I have briefly reviewed patients previous medical records in [facility].  Brief Narrative:  58 year old male, lives with friends/family, independent, PMH of COPD, chronic hypoxic respiratory failure on home oxygen 2-4 L (at rest and with activity respectively), initially presented to [facility] due to sudden onset of dyspnea and productive cough, transferred to ICU under CCM care with diagnosis of COPD exacerbation, rule out Covid-19.  Clinically improved and transferred to floor and TRH care on 4/6. Covi-19 test result negative (faxed from [facility] on 4/7).  COPD exacerbation slowly improving but breathing not yet at baseline.  Patient transferred out of 2W.  Assessment & Plan:   Active Problems:   COPD (chronic obstructive pulmonary disease) (HCC)   Suspected Covid-19 Virus Infection   1. Acute atypical infectious pneumonia: As per pulmonology follow-up today, patient had atypical appearance (tree-in-bud) on CT chest.  PCT elevated and urine pneumococcal antigen positive.  Doubt Covid but results from [facility] pending.  MRSA PCR negative.  Blood cultures x2: Negative to date.  Chest x-ray 4/5 clear.  On empiric IV antibiotics since 4/4, currently on ceftriaxone and azithromycin, plan was to continue while hospitalized then transition to oral doxycycline as per CCM recommendations to complete total 10 days course covering for possible atypicals given CT chest appearance.  However patient reports intermittent pruritic skin rash, unclear etiology, not sure if it is related to IV antibiotics and hence discontinued IV antibiotics on 4/7 and started oral doxycycline.  Outpatient follow-up with PCP and Pulmonology.  BNP 89, ferritin 82, CRP 21, lactate normal, procalcitonin 7.65, d-dimer 1.8, HIV screen negative 2. Covid-19 testing negative: Received fax from [facility] on 4/7 indicating negative testing.  Discontinue isolation precautions. 3. Acute on chronic hypoxemic respiratory failure: Due to underlying severe COPD complicated by atypical pneumonia.  Patient currently back to his baseline home oxygen but continues to report dyspnea with mild exertion and breathing not yet at baseline.  He does not feel comfortable returning home especially due to recent hospitalization and had to come back again.  Continue current management and follow closely.  Added flutter valve. 4. COPD exacerbation: Has underlying severe COPD.  Former smoker quit more than a decade ago.  Antibiotics as above.  Continue prednisone 40 mg daily and taper at discharge.  Continue Breo and Incruse. 5. Leukocytosis: Secondary to steroids.  Improving. 6. Rash: Intermittent.  Discontinued IV antibiotics.  Continue PRN Benadryl.  Monitor closely.  Unclear etiology.  Isolation/PPE: Droplet and contact.  Patient: Surgical face mask.  Provider: Head cover, CAPR, gown, gloves and shoe cover.  All isolation discontinued 4/7. DVT prophylaxis: Lovenox Code Status: Full Family Communication: None at bedside Disposition: DC home pending clinical improvement, possibly in the next 24-48 hours.   Consultants:  PCCM-signed off 4/6.  Procedures:  None.  Antimicrobials:  IV cefepime x1 dose 4/4 IV ceftriaxone and azithromycin 4/5 > 4/7 Oral doxycycline 4/7 >   Subjective: Mild headache.  Intermittent pruritic rash on his right leg/knee and forearms.  Dyspnea slowly improving but still dyspneic with mild exertion and breathing is not yet at baseline and he does not feel comfortable returning home.  No chest pain.  Mild dry cough.    ROS: As above, otherwise negative  Objective:  Vitals:   06/05/18 0800 06/05/18 1100 06/05/18 1118 06/05/18 1206  BP: 131/90  124/81   Pulse: 80  89   Resp: 20     Temp: (!) 97.5 F (36.4 C)  97.8 F (36.6 C)   TempSrc: Oral  Oral   SpO2: 94%  95%    Weight:    86.5 kg  Height:  5\' 5"  (1.651 m)      Examination:  General exam: Pleasant young male, moderately built and nourished sitting up comfortably in bed.  He does not appear in any distress. Respiratory system: Mild harsh breath sounds bilaterally with occasional wheezing but no crackles.  No increased work of breathing.  Able to speak in full sentences. Cardiovascular system: S1 & S2 heard, RRR. No JVD, murmurs, rubs, gallops or clicks. No pedal edema.  Stable. Gastrointestinal system: Abdomen is nondistended, soft and nontender. No organomegaly or masses felt. Normal bowel sounds heard.  Stable. Central nervous system: Alert and oriented. No focal neurological deficits.  Stable. Extremities: Symmetric 5 x 5 power. Skin: No rashes, lesions or ulcers Psychiatry: Judgement and insight appear normal. Mood & affect appropriate.     Data Reviewed: I have personally reviewed following labs and imaging studies  CBC: Recent Labs  Lab 06/03/18 0336 06/03/18 0449 06/04/18 0757  WBC 33.0*  --  20.0*  HGB 13.6 14.6 12.3*  HCT 43.3 43.0 40.8  MCV 87.7  --  89.9  PLT 227  --  207   Basic Metabolic Panel: Recent Labs  Lab 06/03/18 0336 06/03/18 0449  NA 134* 134*  K 4.5 4.6  CL 98  --   CO2 27  --   GLUCOSE 160*  --   BUN 13  --   CREATININE 0.80  --   CALCIUM 9.0  --   MG 2.2  --   PHOS 2.5  --    Liver Function Tests: Recent Labs  Lab 06/03/18 0336  AST 16  ALT 16  ALKPHOS 51  BILITOT 0.7  PROT 6.3*  ALBUMIN 3.3*   Cardiac Enzymes: Recent Labs  Lab 06/03/18 0306  TROPONINI <0.03   CBG: Recent Labs  Lab 06/02/18 2330  GLUCAP 187*    Recent Results (from the past 240 hour(s))  MRSA PCR Screening     Status: None   Collection Time: 06/02/18 11:40 PM  Result Value Ref Range Status   MRSA by PCR NEGATIVE NEGATIVE Final    Comment:        The GeneXpert MRSA Assay (FDA approved for NASAL specimens only), is one component of a comprehensive MRSA  colonization surveillance program. It is not intended to diagnose MRSA infection nor to guide or monitor treatment for MRSA infections. Performed at [facility], [address] [place], [place] [address]   Culture, blood (routine x 2)     Status: None (Preliminary result)   Collection Time: 06/03/18  2:59 AM  Result Value Ref Range Status   Specimen Description BLOOD RIGHT ANTECUBITAL  Final   Special Requests   Final    BOTTLES DRAWN AEROBIC ONLY Blood Culture adequate volume   Culture   Final    NO GROWTH 2 DAYS Performed at [facility], [address] [place], [place] [address]    Report Status PENDING  Incomplete  Culture, blood (routine x 2)     Status: None (Preliminary result)   Collection Time: 06/03/18  3:05 AM  Result Value Ref Range Status   Specimen Description BLOOD LEFT HAND  Final   Special Requests   Final  BOTTLES DRAWN AEROBIC ONLY Blood Culture adequate volume   Culture   Final    NO GROWTH 2 DAYS Performed at [facility], [address], [place], [place] [address]    Report Status PENDING  Incomplete         Radiology Studies: No results found.      Scheduled Meds:  enoxaparin (LOVENOX) injection  40 mg Subcutaneous Q24H   fluticasone furoate-vilanterol  1 puff Inhalation Daily   mouth rinse  15 mL Mouth Rinse BID   predniSONE  40 mg Oral Q breakfast   umeclidinium bromide  1 puff Inhalation Daily   Continuous Infusions:  azithromycin 500 mg (06/05/18 0850)   cefTRIAXone (ROCEPHIN)  IV Stopped (06/05/18 0021)     LOS: 3 days     Marcellus Scott, MD, FACP, [facility]. [facility]  To contact the attending provider between 7A-7P or the covering provider during after hours 7P-7A, please log into the web site [email or web address] and access using universal [place] password for that web site. If you do not have the password, please call the hospital operator.  06/05/2018, 12:47 PM

## 2018-06-06 MED ORDER — METHYLPREDNISOLONE SODIUM SUCC 40 MG IJ SOLR
40.0000 mg | Freq: Two times a day (BID) | INTRAMUSCULAR | Status: DC
  Administered 2018-06-06 – 2018-06-07 (×2): 40 mg via INTRAVENOUS
  Filled 2018-06-06 (×2): qty 1

## 2018-06-06 MED ORDER — LIP MEDEX EX OINT
1.0000 "application " | TOPICAL_OINTMENT | CUTANEOUS | Status: DC | PRN
  Filled 2018-06-06: qty 7

## 2018-06-06 MED ORDER — SALINE SPRAY 0.65 % NA SOLN
1.0000 | NASAL | Status: DC | PRN
  Filled 2018-06-06: qty 44

## 2018-06-06 MED ORDER — PHENOL 1.4 % MT LIQD: 1.0000 | OROMUCOSAL | Status: DC | PRN

## 2018-06-06 NOTE — Progress Notes (Signed)
Lip balm requested for dry lips; stated medline mouth moisturizer did not work. Paged NP Craige Cotta for general nursing orders or Carmex.  Persistent wheezing; PRN Albuterol inhaler given at 0110.  PRN Tylenol for HA at 0415  Paged NP Craige Cotta at [phone] about patient's worsening rash that was covering arms, legs and now abdomen and back. PRN Benadryl given at 0422. Patient declined ice packs to ease discomfort.

## 2018-06-06 NOTE — Progress Notes (Signed)
PROGRESS NOTE  Cody LenzGarner Ted Fiero  WUJ:[id number] DOB: 10/07/1960 DOA: 06/02/2018 PCP: Aida PufferLittle, [name], MD   Brief Narrative: Cody Swanson is a 58 y.o. male with a history of chronic hypoxic respiratory failure, COPD who presented to the ED with dyspnea and productive cough [facility] ICU for COPD exacerbation, and covid-19 rule out. Testing for covid was negative from 4/7 though pneumococcal urine antigen is positive. With continued improvement, patient progressed and transferred to the floor on doxycycline. Wheezing continues and patient is not yet breathing back at his baseline.    Assessment & Plan: Active Problems:   COPD (chronic obstructive pulmonary disease) (HCC)   Suspected Covid-19 Virus Infection  Acute infectious pneumonia: With pneumococcal Ag positive but atypical tree-in-bud appearance on CT chest. Covid ruled out.  - Continue doxycycline for total 10 days as recommended by pulmonology.  - Blood cultures remain negative x3 days.  Acute on chronic hypoxic respiratory failure due to COPD exacerbation and pneumonia:  - Continue supplemental oxygen as needed to maintain saturations.  - Continue steroids and breathing treatments - Continue antibiotics.  - Follow up with pulmonology as outpatient.  Maculopapular rash: Unclear etiology, currently resolved.  - Continue prn benadryl and monitor.  DVT prophylaxis: Lovenox Code Status: Full Family Communication: None at bedside, declined offer to call family. Disposition Plan: Home in next 24 hours if improvement continues  Consultants:   CCM primary initially  Procedures:   None  Antimicrobials:  Cefepime 4/4  Ceftriaxone, azithromycin 4/5 - 4/7  Doxycycline 4/7 >>   Subjective: Breathing better, still some wheezing intermittently and not at baseline. Cough is persistent. Rash resolved today.  Objective: Vitals:   06/06/18 0434 06/06/18 0538 06/06/18 0824 06/06/18 0826  BP: 138/84 (!)  141/83    Pulse: 88 88  100  Resp:  18  20  Temp: 97.7 F (36.5 C) 98.5 F (36.9 C)    TempSrc: Oral Oral    SpO2: 90% 98% 93% 93%  Weight:  81.5 kg    Height:        Intake/Output Summary (Last 24 hours) at 06/06/2018 1323 Last data filed at 06/06/2018 0115 Gross per 24 hour  Intake 240 ml  Output 200 ml  Net 40 ml   Filed Weights   06/05/18 0218 06/05/18 1206 06/06/18 0538  Weight: 86.1 kg 86.5 kg 81.5 kg    Gen: 58 y.o. male in no distress  Pulm: Mildly labored mildly tachypneic with inspiratory and expiratory wheezing. CV: Regular rate and rhythm. No murmur, rub, or gallop. No JVD, no pedal edema. GI: Abdomen soft, non-tender, non-distended, with normoactive bowel sounds. No organomegaly or masses felt. Ext: Warm, no deformities Skin: No rashes, lesions or ulcers on skin examination Neuro: Alert and oriented. No focal neurological deficits. Psych: Judgement and insight appear normal. Mood & affect appropriate.   Data Reviewed: I have personally reviewed following labs and imaging studies  CBC: Recent Labs  Lab 06/03/18 0336 06/03/18 0449 06/04/18 0757  WBC 33.0*  --  20.0*  HGB 13.6 14.6 12.3*  HCT 43.3 43.0 40.8  MCV 87.7  --  89.9  PLT 227  --  207   Basic Metabolic Panel: Recent Labs  Lab 06/03/18 0336 06/03/18 0449  NA 134* 134*  K 4.5 4.6  CL 98  --   CO2 27  --   GLUCOSE 160*  --   BUN 13  --   CREATININE 0.80  --   CALCIUM 9.0  --  MG 2.2  --   PHOS 2.5  --    GFR: Estimated Creatinine Clearance: 100.1 mL/min (by C-G formula based on SCr of 0.8 mg/dL). Liver Function Tests: Recent Labs  Lab 06/03/18 0336  AST 16  ALT 16  ALKPHOS 51  BILITOT 0.7  PROT 6.3*  ALBUMIN 3.3*   No results for input(s): LIPASE, AMYLASE in the last 168 hours. No results for input(s): AMMONIA in the last 168 hours. Coagulation Profile: No results for input(s): INR, PROTIME in the last 168 hours. Cardiac Enzymes: Recent Labs  Lab 06/03/18 0306   TROPONINI <0.03   BNP (last 3 results) No results for input(s): PROBNP in the last 8760 hours. HbA1C: No results for input(s): HGBA1C in the last 72 hours. CBG: Recent Labs  Lab 06/02/18 2330  GLUCAP 187*   Lipid Profile: No results for input(s): CHOL, HDL, LDLCALC, TRIG, CHOLHDL, LDLDIRECT in the last 72 hours. Thyroid Function Tests: No results for input(s): TSH, T4TOTAL, FREET4, T3FREE, THYROIDAB in the last 72 hours. Anemia Panel: No results for input(s): VITAMINB12, FOLATE, FERRITIN, TIBC, IRON, RETICCTPCT in the last 72 hours. Urine analysis: No results found for: COLORURINE, APPEARANCEUR, LABSPEC, PHURINE, GLUCOSEU, HGBUR, BILIRUBINUR, KETONESUR, PROTEINUR, UROBILINOGEN, NITRITE, LEUKOCYTESUR Recent Results (from the past 240 hour(s))  MRSA PCR Screening     Status: None   Collection Time: 06/02/18 11:40 PM  Result Value Ref Range Status   MRSA by PCR NEGATIVE NEGATIVE Final    Comment:        The GeneXpert MRSA Assay (FDA approved for NASAL specimens only), is one component of a comprehensive MRSA colonization surveillance program. It is not intended to diagnose MRSA infection nor to guide or monitor treatment for MRSA infections. Performed at [facility], [address] [place], [place] [address]   Culture, blood (routine x 2)     Status: None (Preliminary result)   Collection Time: 06/03/18  2:59 AM  Result Value Ref Range Status   Specimen Description BLOOD RIGHT ANTECUBITAL  Final   Special Requests   Final    BOTTLES DRAWN AEROBIC ONLY Blood Culture adequate volume   Culture   Final    NO GROWTH 3 DAYS Performed at [facility], [address], [place], [place] [address]    Report Status PENDING  Incomplete  Culture, blood (routine x 2)     Status: None (Preliminary result)   Collection Time: 06/03/18  3:05 AM  Result Value Ref Range Status   Specimen Description BLOOD LEFT HAND  Final   Special Requests   Final    BOTTLES DRAWN AEROBIC  ONLY Blood Culture adequate volume   Culture   Final    NO GROWTH 3 DAYS Performed at [facility], [address], [place], [place] [address]    Report Status PENDING  Incomplete      Radiology Studies: No results found.  Scheduled Meds: . doxycycline  100 mg Oral Q12H  . enoxaparin (LOVENOX) injection  40 mg Subcutaneous Q24H  . fluticasone furoate-vilanterol  1 puff Inhalation Daily  . mouth rinse  15 mL Mouth Rinse BID  . predniSONE  40 mg Oral Q breakfast  . umeclidinium bromide  1 puff Inhalation Daily   Continuous Infusions:   LOS: 4 days   Time spent: 25 minutes.  Tyrone Nine, MD [facility] [email or web address] Password [email or web address] 06/06/2018, 1:23 PM

## 2018-06-07 DIAGNOSIS — J13 Pneumonia due to Streptococcus pneumoniae: Principal | ICD-10-CM

## 2018-06-07 MED ORDER — CEFUROXIME AXETIL 500 MG PO TABS: 500.0000 mg | ORAL_TABLET | Freq: Two times a day (BID) | ORAL | 0 refills | Status: AC

## 2018-06-07 MED ORDER — PREDNISONE 20 MG PO TABS: 40.0000 mg | ORAL_TABLET | Freq: Every day | ORAL | 0 refills | Status: AC

## 2018-06-07 MED ORDER — IPRATROPIUM-ALBUTEROL 0.5-2.5 (3) MG/3ML IN SOLN: 3.0000 mL | RESPIRATORY_TRACT | Status: AC | PRN

## 2018-06-07 MED ORDER — FLUTICASONE FUROATE-VILANTEROL 100-25 MCG/INH IN AEPB: 1.0000 | INHALATION_SPRAY | Freq: Every day | RESPIRATORY_TRACT | 0 refills | Status: AC

## 2018-06-07 MED ORDER — UMECLIDINIUM BROMIDE 62.5 MCG/INH IN AEPB: 1.0000 | INHALATION_SPRAY | Freq: Every day | RESPIRATORY_TRACT | 0 refills | Status: AC

## 2018-06-07 NOTE — Discharge Summary (Signed)
Physician Discharge Summary  Cody Swanson Cody Swanson ZOX:[id number] DOB: 13-Sep-1960 DOA: 06/02/2018  PCP: Aida PufferLittle, [name], MD  Admit date: 06/02/2018 Discharge date: 06/07/2018  Admitted From: Home Disposition: Home  Recommendations for Outpatient Follow-up:  1. Follow up with PCP in 1 week with repeat CBC/BMP 2. Outpatient follow-up with pulmonary 3. Follow up in ED if symptoms worsen or new appear   Home Health: No Equipment/Devices: None  Discharge Condition: Stable CODE STATUS: Full Diet recommendation: Heart healthy/carb modified  Brief/Interim Summary: 58 year old male with history of chronic hypoxic respiratory failure, COPD presented to the ED with dyspnea and productive cough and was [facility] ICU for COPD exacerbation and COVID-19 rule out.  Testing for COVID-19 was negative from 06/05/2018; urine pneumococcal antigen was positive.  He was treated with IV antibiotics and steroids.  His condition improved, [facility]  He has much improved.  He will be discharged on oral prednisone and oral antibiotics.  Discharge Diagnoses:  Active Problems:   COPD (chronic obstructive pulmonary disease) (HCC)  Acute pneumococcal pneumonia -Urine pneumococcal antigen was positive.  CT chest showed atypical tree-in-bud appearance. -Treated with IV antibiotics and oral doxycycline.  Discharged home on oral Ceftin for 5 days. -Cultures have been negative so far -COVID-19 testing negative and has been ruled out  Acute on chronic hypoxic respiratory failure -From COPD exacerbation and pneumonia. -Respiratory status is much improved.  Outpatient follow-up with pulmonary  COPD exacerbation -Treated with intravenous steroids and nebs.  Will switch to oral prednisone 40 mg daily for 7 days.  Continue Breo and Incruse Ellipta.  Outpatient follow-up with pulmonary  Maculopapular rash--unclear etiology.  Improving.  Outpatient  follow-up  Leukocytosis -Secondary to COPD exacerbation/pneumonia/reactive.  Discharge Instructions  Discharge Instructions    Ambulatory referral to Pulmonology   Complete by:  As directed    COPD exacerbation   Call MD for:  difficulty breathing, headache or visual disturbances   Complete by:  As directed    Call MD for:  extreme fatigue   Complete by:  As directed    Call MD for:  temperature >100.4   Complete by:  As directed    Diet - low sodium heart healthy   Complete by:  As directed    Diet Carb Modified   Complete by:  As directed    Increase activity slowly   Complete by:  As directed      Allergies as of 06/07/2018      Reactions   Penicillins Shortness Of Breath, Rash   Did it involve swelling of the face/tongue/throat, SOB, or low BP? Yes Did it involve sudden or severe rash/hives, skin peeling, or any reaction on the inside of your mouth or nose? No Did you need to seek medical attention at a hospital or doctor's office? No When did it last happen?more than 10 years If all above answers are "NO", may proceed with cephalosporin use.      Medication List    TAKE these medications   albuterol 108 (90 Base) MCG/ACT inhaler Commonly known as:  PROVENTIL HFA;VENTOLIN HFA Inhale 2 puffs into the lungs every 4 (four) hours as needed for wheezing or shortness of breath.   busPIRone 5 MG tablet Commonly known as:  BUSPAR Take 5 mg by mouth 3 (three) times daily.   cefUROXime 500 MG tablet Commonly known as:  CEFTIN Take 1 tablet (500 mg total) by mouth 2 (two) times daily for 5 days.  fluticasone furoate-vilanterol 100-25 MCG/INH Aepb Commonly known as:  BREO ELLIPTA Inhale 1 puff into the lungs daily. Start taking on:  June 08, 2018   ipratropium-albuterol 0.5-2.5 (3) MG/3ML Soln Commonly known as:  DUONEB Take 3 mLs by nebulization every 4 (four) hours as needed. What changed:    when to take this  reasons to take this   metFORMIN 500 MG  tablet Commonly known as:  GLUCOPHAGE Take by mouth 2 (two) times daily with a meal.   oxyCODONE-acetaminophen 5-325 MG tablet Commonly known as:  PERCOCET/ROXICET Take 1 tablet by mouth 3 (three) times daily as needed for moderate pain or severe pain.   predniSONE 20 MG tablet Commonly known as:  Deltasone Take 2 tablets (40 mg total) by mouth daily for 7 days.   umeclidinium bromide 62.5 MCG/INH Aepb Commonly known as:  INCRUSE ELLIPTA Inhale 1 puff into the lungs daily. Start taking on:  June 08, 2018   Vitamin D (Ergocalciferol) 1.25 MG (50000 UT) Caps capsule Commonly known as:  DRISDOL Take 50,000 Units by mouth every 7 (seven) days.      Follow-up Information    Swanson, [name], MD. Schedule an appointment as soon as possible for a visit in 1 week(s).   Specialty:  Family Medicine Contact information: [address] [place] [address] [phone]          Allergies  Allergen Reactions  . Penicillins Shortness Of Breath and Rash    Did it involve swelling of the face/tongue/throat, SOB, or low BP? Yes Did it involve sudden or severe rash/hives, skin peeling, or any reaction on the inside of your mouth or nose? No Did you need to seek medical attention at a hospital or doctor's office? No When did it last happen?more than 10 years If all above answers are "NO", may proceed with cephalosporin use.     Consultations:  PCCM   Procedures/Studies: Dg Chest Port 1 View  Result Date: 06/03/2018 CLINICAL DATA:  Pneumonia and shortness of breath EXAM: PORTABLE CHEST 1 VIEW COMPARISON:  Chest CT 06/02/2018 FINDINGS: The heart size and mediastinal contours are within normal limits. The lung bases are excluded from view but the visualized lungs are clear. The visualized skeletal structures are unremarkable. IMPRESSION: Lung bases are not within the included field of view. The visualized lungs are clear. Electronically Signed   By: Deatra Robinson M.D.   On:  06/03/2018 02:38       Subjective: Patient seen and examined at bedside.  She feels that his breathing is much better and wants to go home.  No overnight fever, nausea or vomiting.  Discharge Exam: Vitals:   06/07/18 0450 06/07/18 0830  BP: (!) 151/96   Pulse: 88   Resp: 20   Temp: 98 F (36.7 C)   SpO2: 96% 97%    General: Pt is alert, awake, not in acute distress Cardiovascular: rate controlled, S1/S2 + Respiratory: bilateral decreased breath sounds at bases, very minimal wheezing Abdominal: Soft, NT, ND, bowel sounds + Extremities: no edema, no cyanosis    The results of significant diagnostics from this hospitalization (including imaging, microbiology, ancillary and laboratory) are listed below for reference.     Microbiology: Recent Results (from the past 240 hour(s))  MRSA PCR Screening     Status: None   Collection Time: 06/02/18 11:40 PM  Result Value Ref Range Status   MRSA by PCR NEGATIVE NEGATIVE Final    Comment:  The GeneXpert MRSA Assay (FDA approved for NASAL specimens only), is one component of a comprehensive MRSA colonization surveillance program. It is not intended to diagnose MRSA infection nor to guide or monitor treatment for MRSA infections. Performed at [facility], [address] [place], [place] [address]   Culture, blood (routine x 2)     Status: None (Preliminary result)   Collection Time: 06/03/18  2:59 AM  Result Value Ref Range Status   Specimen Description BLOOD RIGHT ANTECUBITAL  Final   Special Requests   Final    BOTTLES DRAWN AEROBIC ONLY Blood Culture adequate volume   Culture   Final    NO GROWTH 4 DAYS Performed at [facility], [address] [place], [place] [address]    Report Status PENDING  Incomplete  Culture, blood (routine x 2)     Status: None (Preliminary result)   Collection Time: 06/03/18  3:05 AM  Result Value Ref Range Status   Specimen Description BLOOD LEFT HAND  Final   Special  Requests   Final    BOTTLES DRAWN AEROBIC ONLY Blood Culture adequate volume   Culture   Final    NO GROWTH 4 DAYS Performed at [facility], [address], [place], [place] [address]    Report Status PENDING  Incomplete     Labs: BNP (last 3 results) Recent Labs    06/03/18 0306  BNP 88.5   Basic Metabolic Panel: Recent Labs  Lab 06/03/18 0336 06/03/18 0449  NA 134* 134*  K 4.5 4.6  CL 98  --   CO2 27  --   GLUCOSE 160*  --   BUN 13  --   CREATININE 0.80  --   CALCIUM 9.0  --   MG 2.2  --   PHOS 2.5  --    Liver Function Tests: Recent Labs  Lab 06/03/18 0336  AST 16  ALT 16  ALKPHOS 51  BILITOT 0.7  PROT 6.3*  ALBUMIN 3.3*   No results for input(s): LIPASE, AMYLASE in the last 168 hours. No results for input(s): AMMONIA in the last 168 hours. CBC: Recent Labs  Lab 06/03/18 0336 06/03/18 0449 06/04/18 0757  WBC 33.0*  --  20.0*  HGB 13.6 14.6 12.3*  HCT 43.3 43.0 40.8  MCV 87.7  --  89.9  PLT 227  --  207   Cardiac Enzymes: Recent Labs  Lab 06/03/18 0306  TROPONINI <0.03   BNP: Invalid input(s): POCBNP CBG: Recent Labs  Lab 06/02/18 2330  GLUCAP 187*   D-Dimer No results for input(s): DDIMER in the last 72 hours. Hgb A1c No results for input(s): HGBA1C in the last 72 hours. Lipid Profile No results for input(s): CHOL, HDL, LDLCALC, TRIG, CHOLHDL, LDLDIRECT in the last 72 hours. Thyroid function studies No results for input(s): TSH, T4TOTAL, T3FREE, THYROIDAB in the last 72 hours.  Invalid input(s): FREET3 Anemia work up No results for input(s): VITAMINB12, FOLATE, FERRITIN, TIBC, IRON, RETICCTPCT in the last 72 hours. Urinalysis No results found for: COLORURINE, APPEARANCEUR, LABSPEC, PHURINE, GLUCOSEU, HGBUR, BILIRUBINUR, KETONESUR, PROTEINUR, UROBILINOGEN, NITRITE, LEUKOCYTESUR Sepsis Labs Invalid input(s): PROCALCITONIN,  WBC,  LACTICIDVEN Microbiology Recent Results (from the past 240 hour(s))  MRSA PCR Screening      Status: None   Collection Time: 06/02/18 11:40 PM  Result Value Ref Range Status   MRSA by PCR NEGATIVE NEGATIVE Final    Comment:        The GeneXpert  MRSA Assay (FDA approved for NASAL specimens only), is one component of a comprehensive MRSA colonization surveillance program. It is not intended to diagnose MRSA infection nor to guide or monitor treatment for MRSA infections. Performed at [facility], [address], [place], [place] [address]   Culture, blood (routine x 2)     Status: None (Preliminary result)   Collection Time: 06/03/18  2:59 AM  Result Value Ref Range Status   Specimen Description BLOOD RIGHT ANTECUBITAL  Final   Special Requests   Final    BOTTLES DRAWN AEROBIC ONLY Blood Culture adequate volume   Culture   Final    NO GROWTH 4 DAYS Performed at [facility], [address], [place], [place] [address]    Report Status PENDING  Incomplete  Culture, blood (routine x 2)     Status: None (Preliminary result)   Collection Time: 06/03/18  3:05 AM  Result Value Ref Range Status   Specimen Description BLOOD LEFT HAND  Final   Special Requests   Final    BOTTLES DRAWN AEROBIC ONLY Blood Culture adequate volume   Culture   Final    NO GROWTH 4 DAYS Performed at [facility], [address] [place], [place] [address]    Report Status PENDING  Incomplete     Time coordinating discharge: 35 minutes  SIGNED:   Glade Lloyd, MD  [facility] 06/07/2018, 8:59 AM

## 2018-06-07 NOTE — TOC Transition Note (Signed)
Transition of Care [facility]) - CM/SW Discharge Note   Patient Details  Name: Cuahutemoc Tumey MRN: [id number] Date of Birth: 14-Jan-1961  [facility] CM/SW Contact:  Epifanio Lesches, RN Phone Number: 06/07/2018, 9:20 AM   Clinical Narrative:    Transition to home. States resides with friends.Independent with ADLS, on home oxygen...portable tank @ bedside for transportation to home. Pt states will f/u with PCP for post hospital f/u appointment. Pt transportation to home , no medication concerns.   Final next level of care: Home/Self Care Barriers to Discharge: Barriers Resolved   Patient Goals and CMS Choice Patient states their goals for this hospitalization and ongoing recovery are:: to go home CMS Medicare.gov Compare Post Acute Care list provided to:: Patient Choice offered to / list presented to : NA  Discharge Placement                       Discharge Plan and Services In-house Referral: NA Discharge Planning Services: NA Post Acute Care Choice: Durable Medical Equipment          DME Arranged: N/A DME Agency: NA([facility]/ oxygen) HH Arranged: NA HH Agency: NA   Social Determinants of Health (SDOH) Interventions     Readmission Risk Interventions No flowsheet data found.

## 2018-06-08 LAB — CULTURE, BLOOD (ROUTINE X 2)
Culture: NO GROWTH
Culture: NO GROWTH
Special Requests: ADEQUATE
Special Requests: ADEQUATE

## 2018-07-01 DIAGNOSIS — R0902 Hypoxemia: Secondary | ICD-10-CM | POA: Diagnosis not present

## 2018-07-01 DIAGNOSIS — E871 Hypo-osmolality and hyponatremia: Secondary | ICD-10-CM

## 2018-07-01 DIAGNOSIS — J441 Chronic obstructive pulmonary disease with (acute) exacerbation: Secondary | ICD-10-CM

## 2018-07-01 DIAGNOSIS — R0689 Other abnormalities of breathing: Secondary | ICD-10-CM

## 2018-07-02 DIAGNOSIS — I361 Nonrheumatic tricuspid (valve) insufficiency: Secondary | ICD-10-CM

## 2018-07-02 DIAGNOSIS — Z720 Tobacco use: Secondary | ICD-10-CM

## 2018-07-02 DIAGNOSIS — I5031 Acute diastolic (congestive) heart failure: Secondary | ICD-10-CM | POA: Diagnosis not present

## 2018-07-02 DIAGNOSIS — J9621 Acute and chronic respiratory failure with hypoxia: Secondary | ICD-10-CM

## 2018-07-02 DIAGNOSIS — I272 Pulmonary hypertension, unspecified: Secondary | ICD-10-CM

## 2018-07-02 DIAGNOSIS — J441 Chronic obstructive pulmonary disease with (acute) exacerbation: Secondary | ICD-10-CM | POA: Diagnosis not present

## 2018-07-02 DIAGNOSIS — I071 Rheumatic tricuspid insufficiency: Secondary | ICD-10-CM

## 2018-07-03 DIAGNOSIS — I272 Pulmonary hypertension, unspecified: Secondary | ICD-10-CM | POA: Diagnosis not present

## 2018-07-03 DIAGNOSIS — I5031 Acute diastolic (congestive) heart failure: Secondary | ICD-10-CM | POA: Diagnosis not present

## 2018-07-03 DIAGNOSIS — J441 Chronic obstructive pulmonary disease with (acute) exacerbation: Secondary | ICD-10-CM | POA: Diagnosis not present

## 2018-07-03 DIAGNOSIS — I071 Rheumatic tricuspid insufficiency: Secondary | ICD-10-CM | POA: Diagnosis not present

## 2018-12-26 ENCOUNTER — Other Ambulatory Visit: Payer: Self-pay

## 2019-01-23 DIAGNOSIS — J441 Chronic obstructive pulmonary disease with (acute) exacerbation: Secondary | ICD-10-CM

## 2019-01-23 DIAGNOSIS — E871 Hypo-osmolality and hyponatremia: Secondary | ICD-10-CM

## 2019-01-24 DIAGNOSIS — J9621 Acute and chronic respiratory failure with hypoxia: Secondary | ICD-10-CM | POA: Diagnosis not present

## 2019-01-24 DIAGNOSIS — J441 Chronic obstructive pulmonary disease with (acute) exacerbation: Secondary | ICD-10-CM | POA: Diagnosis not present

## 2019-01-24 DIAGNOSIS — E871 Hypo-osmolality and hyponatremia: Secondary | ICD-10-CM | POA: Diagnosis not present

## 2019-01-25 DIAGNOSIS — J441 Chronic obstructive pulmonary disease with (acute) exacerbation: Secondary | ICD-10-CM | POA: Diagnosis not present

## 2019-01-25 DIAGNOSIS — E871 Hypo-osmolality and hyponatremia: Secondary | ICD-10-CM | POA: Diagnosis not present

## 2019-02-03 DIAGNOSIS — J441 Chronic obstructive pulmonary disease with (acute) exacerbation: Secondary | ICD-10-CM | POA: Diagnosis not present

## 2019-02-03 DIAGNOSIS — J9621 Acute and chronic respiratory failure with hypoxia: Secondary | ICD-10-CM | POA: Diagnosis not present

## 2019-02-04 DIAGNOSIS — J9621 Acute and chronic respiratory failure with hypoxia: Secondary | ICD-10-CM | POA: Diagnosis not present

## 2019-02-04 DIAGNOSIS — J441 Chronic obstructive pulmonary disease with (acute) exacerbation: Secondary | ICD-10-CM | POA: Diagnosis not present

## 2019-02-05 DIAGNOSIS — J441 Chronic obstructive pulmonary disease with (acute) exacerbation: Secondary | ICD-10-CM | POA: Diagnosis not present

## 2019-02-05 DIAGNOSIS — J9621 Acute and chronic respiratory failure with hypoxia: Secondary | ICD-10-CM | POA: Diagnosis not present

## 2019-03-15 DIAGNOSIS — J9621 Acute and chronic respiratory failure with hypoxia: Secondary | ICD-10-CM

## 2019-03-15 DIAGNOSIS — E871 Hypo-osmolality and hyponatremia: Secondary | ICD-10-CM

## 2019-03-15 DIAGNOSIS — J441 Chronic obstructive pulmonary disease with (acute) exacerbation: Secondary | ICD-10-CM

## 2019-03-16 DIAGNOSIS — J9621 Acute and chronic respiratory failure with hypoxia: Secondary | ICD-10-CM | POA: Diagnosis not present

## 2019-03-16 DIAGNOSIS — E871 Hypo-osmolality and hyponatremia: Secondary | ICD-10-CM | POA: Diagnosis not present

## 2019-03-16 DIAGNOSIS — J441 Chronic obstructive pulmonary disease with (acute) exacerbation: Secondary | ICD-10-CM | POA: Diagnosis not present

## 2019-03-17 DIAGNOSIS — J9621 Acute and chronic respiratory failure with hypoxia: Secondary | ICD-10-CM | POA: Diagnosis not present

## 2019-03-17 DIAGNOSIS — J441 Chronic obstructive pulmonary disease with (acute) exacerbation: Secondary | ICD-10-CM | POA: Diagnosis not present

## 2019-03-17 DIAGNOSIS — E871 Hypo-osmolality and hyponatremia: Secondary | ICD-10-CM | POA: Diagnosis not present

## 2019-03-18 DIAGNOSIS — J441 Chronic obstructive pulmonary disease with (acute) exacerbation: Secondary | ICD-10-CM | POA: Diagnosis not present

## 2019-03-18 DIAGNOSIS — J9621 Acute and chronic respiratory failure with hypoxia: Secondary | ICD-10-CM | POA: Diagnosis not present

## 2019-03-18 DIAGNOSIS — E871 Hypo-osmolality and hyponatremia: Secondary | ICD-10-CM | POA: Diagnosis not present

## 2019-04-12 ENCOUNTER — Telehealth: Payer: Self-pay | Admitting: Nurse Practitioner

## 2019-04-12 NOTE — Telephone Encounter (Signed)
Called to Discuss with patient about Covid symptoms and the use of bamlanivimab, a monoclonal antibody infusion for those with mild to moderate Covid symptoms and at a high risk of hospitalization.     Pt is qualified for this infusion at the [facility] infusion center due to co-morbid conditions and/or a member of an at-risk group.     Patient Active Problem List   Diagnosis Date Noted  . COPD (chronic obstructive pulmonary disease) (HCC) 06/03/2018  . Suspected COVID-19 virus infection 06/03/2018    Patient declines infusion at this time. Symptoms tier reviewed as well as criteria for ending isolation. Preventative practices reviewed. Patient verbalized understanding.    Patient advised to call back if he decides that he does want to get infusion. Callback number to the infusion center given. Patient advised to go to Urgent care or ED with severe symptoms.   Patient states that symptoms started today 04/12/19

## 2019-04-28 DIAGNOSIS — I272 Pulmonary hypertension, unspecified: Secondary | ICD-10-CM

## 2019-04-28 DIAGNOSIS — U071 COVID-19: Secondary | ICD-10-CM

## 2019-04-28 DIAGNOSIS — J189 Pneumonia, unspecified organism: Secondary | ICD-10-CM

## 2019-04-28 DIAGNOSIS — A419 Sepsis, unspecified organism: Secondary | ICD-10-CM

## 2019-04-28 DIAGNOSIS — J9621 Acute and chronic respiratory failure with hypoxia: Secondary | ICD-10-CM

## 2019-04-29 DIAGNOSIS — U071 COVID-19: Secondary | ICD-10-CM | POA: Diagnosis not present

## 2019-04-29 DIAGNOSIS — J9621 Acute and chronic respiratory failure with hypoxia: Secondary | ICD-10-CM | POA: Diagnosis not present

## 2019-04-29 DIAGNOSIS — J189 Pneumonia, unspecified organism: Secondary | ICD-10-CM | POA: Diagnosis not present

## 2019-04-29 DIAGNOSIS — A419 Sepsis, unspecified organism: Secondary | ICD-10-CM | POA: Diagnosis not present

## 2019-04-30 DIAGNOSIS — J189 Pneumonia, unspecified organism: Secondary | ICD-10-CM | POA: Diagnosis not present

## 2019-04-30 DIAGNOSIS — U071 COVID-19: Secondary | ICD-10-CM | POA: Diagnosis not present

## 2019-04-30 DIAGNOSIS — A419 Sepsis, unspecified organism: Secondary | ICD-10-CM | POA: Diagnosis not present

## 2019-04-30 DIAGNOSIS — J9621 Acute and chronic respiratory failure with hypoxia: Secondary | ICD-10-CM | POA: Diagnosis not present

## 2019-05-01 DIAGNOSIS — J189 Pneumonia, unspecified organism: Secondary | ICD-10-CM | POA: Diagnosis not present

## 2019-05-01 DIAGNOSIS — A419 Sepsis, unspecified organism: Secondary | ICD-10-CM | POA: Diagnosis not present

## 2019-05-01 DIAGNOSIS — J9621 Acute and chronic respiratory failure with hypoxia: Secondary | ICD-10-CM | POA: Diagnosis not present

## 2019-05-01 DIAGNOSIS — U071 COVID-19: Secondary | ICD-10-CM | POA: Diagnosis not present

## 2019-05-02 DIAGNOSIS — U071 COVID-19: Secondary | ICD-10-CM | POA: Diagnosis not present

## 2019-05-02 DIAGNOSIS — J9621 Acute and chronic respiratory failure with hypoxia: Secondary | ICD-10-CM | POA: Diagnosis not present

## 2019-05-02 DIAGNOSIS — J189 Pneumonia, unspecified organism: Secondary | ICD-10-CM | POA: Diagnosis not present

## 2019-05-02 DIAGNOSIS — A419 Sepsis, unspecified organism: Secondary | ICD-10-CM | POA: Diagnosis not present

## 2019-05-16 IMAGING — DX PORTABLE CHEST - 1 VIEW
1 series · 2 of 2 positions shown · non-contrast
Comparison: Chest CT 06/02/2018

CLINICAL DATA: Pneumonia and shortness of breath

EXAM:
PORTABLE CHEST 1 VIEW

[Series 1: chest · 0.14mm/px · 2 of 2 slices shown]
[im 1/2]
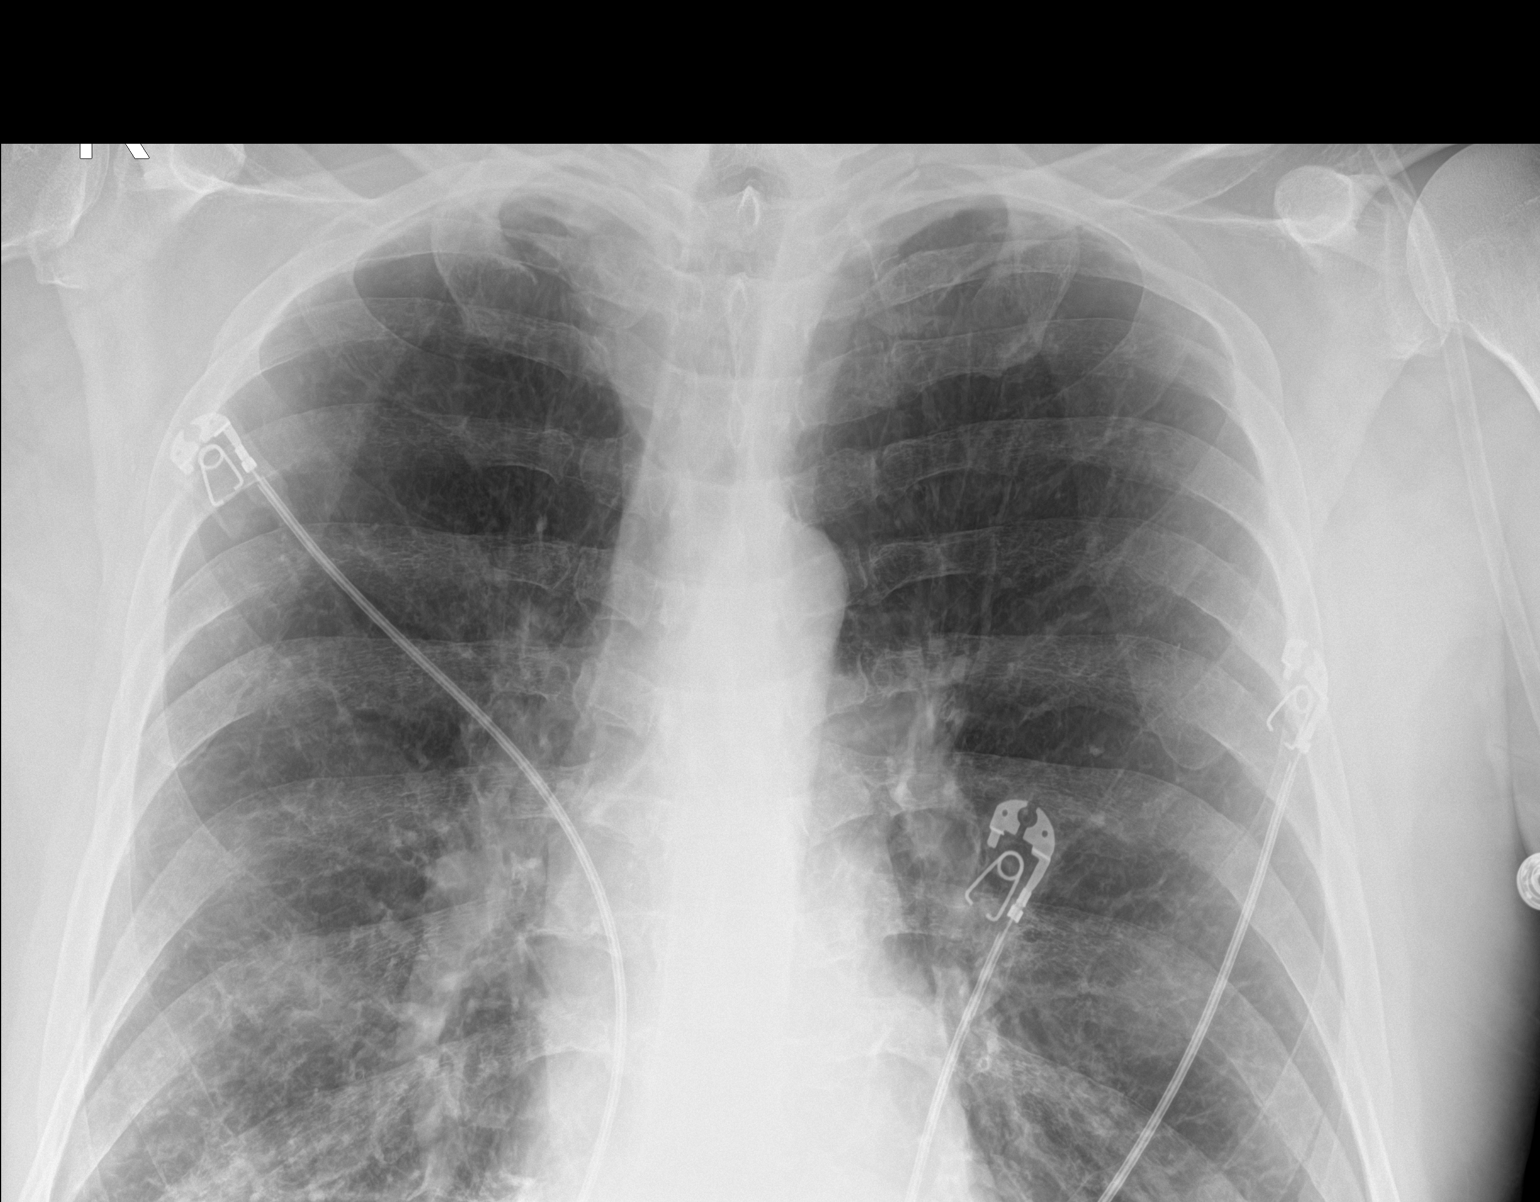
[im 2/2]
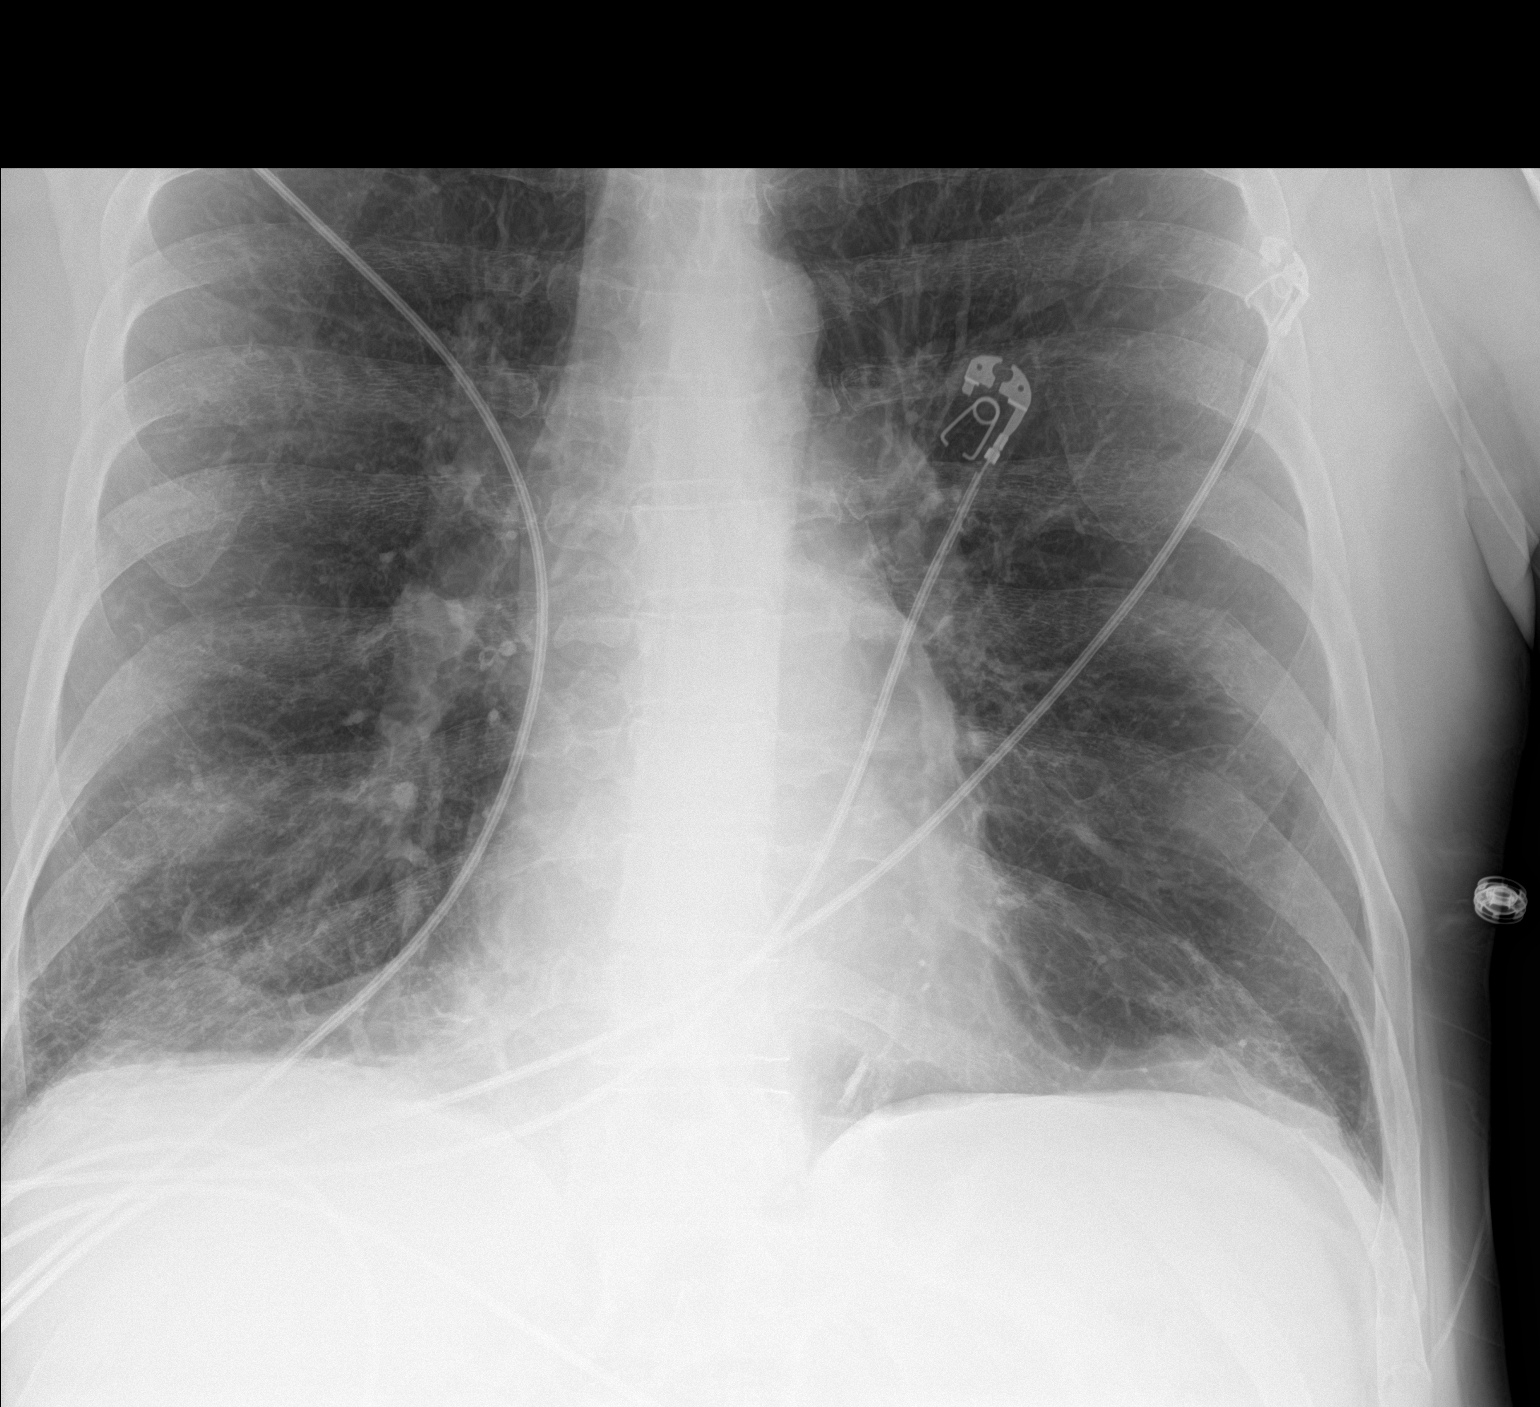

[2 of 2 positions shown; findings below may reference images not displayed]

FINDINGS: The heart size and mediastinal contours are within normal limits.
The lung bases are excluded from view but the visualized lungs are
clear. The visualized skeletal structures are unremarkable.
IMPRESSION: Lung bases are not within the included field of view. The visualized
lungs are clear.

## 2019-07-14 DIAGNOSIS — R0902 Hypoxemia: Secondary | ICD-10-CM | POA: Diagnosis not present

## 2019-07-14 DIAGNOSIS — J441 Chronic obstructive pulmonary disease with (acute) exacerbation: Secondary | ICD-10-CM

## 2019-07-14 DIAGNOSIS — D649 Anemia, unspecified: Secondary | ICD-10-CM | POA: Diagnosis not present

## 2019-07-15 DIAGNOSIS — D649 Anemia, unspecified: Secondary | ICD-10-CM | POA: Diagnosis not present

## 2019-07-15 DIAGNOSIS — J441 Chronic obstructive pulmonary disease with (acute) exacerbation: Secondary | ICD-10-CM | POA: Diagnosis not present

## 2019-07-15 DIAGNOSIS — R0902 Hypoxemia: Secondary | ICD-10-CM | POA: Diagnosis not present

## 2019-07-16 DIAGNOSIS — J441 Chronic obstructive pulmonary disease with (acute) exacerbation: Secondary | ICD-10-CM | POA: Diagnosis not present

## 2019-07-16 DIAGNOSIS — R0902 Hypoxemia: Secondary | ICD-10-CM | POA: Diagnosis not present

## 2019-07-16 DIAGNOSIS — D649 Anemia, unspecified: Secondary | ICD-10-CM | POA: Diagnosis not present

## 2019-07-17 DIAGNOSIS — J441 Chronic obstructive pulmonary disease with (acute) exacerbation: Secondary | ICD-10-CM | POA: Diagnosis not present

## 2019-07-17 DIAGNOSIS — D649 Anemia, unspecified: Secondary | ICD-10-CM | POA: Diagnosis not present

## 2019-07-17 DIAGNOSIS — R0902 Hypoxemia: Secondary | ICD-10-CM | POA: Diagnosis not present

## 2019-07-18 DIAGNOSIS — R0902 Hypoxemia: Secondary | ICD-10-CM | POA: Diagnosis not present

## 2019-07-18 DIAGNOSIS — J441 Chronic obstructive pulmonary disease with (acute) exacerbation: Secondary | ICD-10-CM | POA: Diagnosis not present

## 2019-07-18 DIAGNOSIS — D649 Anemia, unspecified: Secondary | ICD-10-CM | POA: Diagnosis not present

## 2019-07-20 DIAGNOSIS — I509 Heart failure, unspecified: Secondary | ICD-10-CM | POA: Diagnosis not present

## 2019-07-20 DIAGNOSIS — D72829 Elevated white blood cell count, unspecified: Secondary | ICD-10-CM | POA: Diagnosis not present

## 2019-07-20 DIAGNOSIS — R609 Edema, unspecified: Secondary | ICD-10-CM | POA: Diagnosis not present

## 2019-07-20 DIAGNOSIS — J9621 Acute and chronic respiratory failure with hypoxia: Secondary | ICD-10-CM | POA: Diagnosis not present

## 2019-07-21 DIAGNOSIS — D72829 Elevated white blood cell count, unspecified: Secondary | ICD-10-CM | POA: Diagnosis not present

## 2019-07-21 DIAGNOSIS — R609 Edema, unspecified: Secondary | ICD-10-CM | POA: Diagnosis not present

## 2019-07-21 DIAGNOSIS — I5033 Acute on chronic diastolic (congestive) heart failure: Secondary | ICD-10-CM

## 2019-07-21 DIAGNOSIS — J9621 Acute and chronic respiratory failure with hypoxia: Secondary | ICD-10-CM | POA: Diagnosis not present

## 2019-07-21 DIAGNOSIS — I509 Heart failure, unspecified: Secondary | ICD-10-CM | POA: Diagnosis not present

## 2019-07-22 DIAGNOSIS — I509 Heart failure, unspecified: Secondary | ICD-10-CM | POA: Diagnosis not present

## 2019-07-22 DIAGNOSIS — R609 Edema, unspecified: Secondary | ICD-10-CM | POA: Diagnosis not present

## 2019-07-22 DIAGNOSIS — D72829 Elevated white blood cell count, unspecified: Secondary | ICD-10-CM | POA: Diagnosis not present

## 2019-07-22 DIAGNOSIS — J9621 Acute and chronic respiratory failure with hypoxia: Secondary | ICD-10-CM | POA: Diagnosis not present

## 2019-07-23 DIAGNOSIS — D72829 Elevated white blood cell count, unspecified: Secondary | ICD-10-CM | POA: Diagnosis not present

## 2019-07-23 DIAGNOSIS — I509 Heart failure, unspecified: Secondary | ICD-10-CM | POA: Diagnosis not present

## 2019-07-23 DIAGNOSIS — J9621 Acute and chronic respiratory failure with hypoxia: Secondary | ICD-10-CM | POA: Diagnosis not present

## 2019-07-23 DIAGNOSIS — R609 Edema, unspecified: Secondary | ICD-10-CM | POA: Diagnosis not present

## 2019-07-24 DIAGNOSIS — I509 Heart failure, unspecified: Secondary | ICD-10-CM | POA: Diagnosis not present

## 2019-07-24 DIAGNOSIS — J9621 Acute and chronic respiratory failure with hypoxia: Secondary | ICD-10-CM | POA: Diagnosis not present

## 2019-07-24 DIAGNOSIS — R609 Edema, unspecified: Secondary | ICD-10-CM | POA: Diagnosis not present

## 2019-07-24 DIAGNOSIS — D72829 Elevated white blood cell count, unspecified: Secondary | ICD-10-CM | POA: Diagnosis not present

## 2019-07-25 DIAGNOSIS — J9621 Acute and chronic respiratory failure with hypoxia: Secondary | ICD-10-CM | POA: Diagnosis not present

## 2019-07-25 DIAGNOSIS — I509 Heart failure, unspecified: Secondary | ICD-10-CM | POA: Diagnosis not present

## 2019-07-25 DIAGNOSIS — R609 Edema, unspecified: Secondary | ICD-10-CM | POA: Diagnosis not present

## 2019-07-25 DIAGNOSIS — D72829 Elevated white blood cell count, unspecified: Secondary | ICD-10-CM | POA: Diagnosis not present

## 2019-07-26 DIAGNOSIS — R609 Edema, unspecified: Secondary | ICD-10-CM | POA: Diagnosis not present

## 2019-07-26 DIAGNOSIS — J9621 Acute and chronic respiratory failure with hypoxia: Secondary | ICD-10-CM | POA: Diagnosis not present

## 2019-07-26 DIAGNOSIS — D72829 Elevated white blood cell count, unspecified: Secondary | ICD-10-CM | POA: Diagnosis not present

## 2019-07-26 DIAGNOSIS — I509 Heart failure, unspecified: Secondary | ICD-10-CM | POA: Diagnosis not present

## 2019-07-27 DIAGNOSIS — I509 Heart failure, unspecified: Secondary | ICD-10-CM | POA: Diagnosis not present

## 2019-07-27 DIAGNOSIS — R609 Edema, unspecified: Secondary | ICD-10-CM | POA: Diagnosis not present

## 2019-07-27 DIAGNOSIS — D72829 Elevated white blood cell count, unspecified: Secondary | ICD-10-CM | POA: Diagnosis not present

## 2019-07-27 DIAGNOSIS — J9621 Acute and chronic respiratory failure with hypoxia: Secondary | ICD-10-CM | POA: Diagnosis not present

## 2019-07-28 DIAGNOSIS — D72829 Elevated white blood cell count, unspecified: Secondary | ICD-10-CM | POA: Diagnosis not present

## 2019-07-28 DIAGNOSIS — I509 Heart failure, unspecified: Secondary | ICD-10-CM | POA: Diagnosis not present

## 2019-07-28 DIAGNOSIS — J9621 Acute and chronic respiratory failure with hypoxia: Secondary | ICD-10-CM | POA: Diagnosis not present

## 2019-07-28 DIAGNOSIS — R609 Edema, unspecified: Secondary | ICD-10-CM | POA: Diagnosis not present

## 2019-07-29 DIAGNOSIS — J9621 Acute and chronic respiratory failure with hypoxia: Secondary | ICD-10-CM | POA: Diagnosis not present

## 2019-07-29 DIAGNOSIS — I509 Heart failure, unspecified: Secondary | ICD-10-CM | POA: Diagnosis not present

## 2019-07-29 DIAGNOSIS — R609 Edema, unspecified: Secondary | ICD-10-CM | POA: Diagnosis not present

## 2019-07-29 DIAGNOSIS — D72829 Elevated white blood cell count, unspecified: Secondary | ICD-10-CM | POA: Diagnosis not present

## 2020-05-25 DIAGNOSIS — R918 Other nonspecific abnormal finding of lung field: Secondary | ICD-10-CM

## 2020-11-27 DIAGNOSIS — R0609 Other forms of dyspnea: Secondary | ICD-10-CM | POA: Diagnosis not present

## 2021-03-29 DIAGNOSIS — R918 Other nonspecific abnormal finding of lung field: Secondary | ICD-10-CM | POA: Diagnosis not present

## 2023-12-30 DIAGNOSIS — R55 Syncope and collapse: Secondary | ICD-10-CM | POA: Diagnosis not present
